# Patient Record
Sex: Female | Born: 1955
Health system: Southern US, Community
[De-identification: ages and names within clinical notes are randomized; demographics above are authoritative.]

## PROBLEM LIST (undated history)

## (undated) DIAGNOSIS — H269 Unspecified cataract: Secondary | ICD-10-CM

## (undated) DIAGNOSIS — E785 Hyperlipidemia, unspecified: Secondary | ICD-10-CM

## (undated) DIAGNOSIS — T7840XA Allergy, unspecified, initial encounter: Secondary | ICD-10-CM

## (undated) DIAGNOSIS — R0789 Other chest pain: Secondary | ICD-10-CM

## (undated) HISTORY — PX: WISDOM TOOTH EXTRACTION: SHX21

## (undated) HISTORY — DX: Hyperlipidemia, unspecified: E78.5

## (undated) HISTORY — PX: CHOLECYSTECTOMY: SHX55

## (undated) HISTORY — DX: Allergy, unspecified, initial encounter: T78.40XA

## (undated) HISTORY — DX: Unspecified cataract: H26.9

## (undated) HISTORY — PX: COLONOSCOPY: SHX174

## (undated) HISTORY — PX: DILATION AND CURETTAGE OF UTERUS: SHX78

## (undated) HISTORY — DX: Other chest pain: R07.89

---

## 1998-03-01 ENCOUNTER — Ambulatory Visit (HOSPITAL_COMMUNITY): Admission: RE | Admit: 1998-03-01 | Discharge: 1998-03-01 | Payer: Self-pay | Admitting: Obstetrics and Gynecology

## 1998-03-06 ENCOUNTER — Ambulatory Visit (HOSPITAL_COMMUNITY): Admission: RE | Admit: 1998-03-06 | Discharge: 1998-03-06 | Payer: Self-pay | Admitting: Obstetrics and Gynecology

## 1999-12-11 ENCOUNTER — Ambulatory Visit (HOSPITAL_COMMUNITY): Admission: RE | Admit: 1999-12-11 | Discharge: 1999-12-11 | Payer: Self-pay | Admitting: Obstetrics and Gynecology

## 1999-12-11 ENCOUNTER — Encounter: Payer: Self-pay | Admitting: Obstetrics and Gynecology

## 2000-09-09 ENCOUNTER — Other Ambulatory Visit: Admission: RE | Admit: 2000-09-09 | Discharge: 2000-09-09 | Payer: Self-pay | Admitting: *Deleted

## 2001-07-29 ENCOUNTER — Encounter: Payer: Self-pay | Admitting: Obstetrics and Gynecology

## 2001-07-29 ENCOUNTER — Ambulatory Visit (HOSPITAL_COMMUNITY): Admission: RE | Admit: 2001-07-29 | Discharge: 2001-07-29 | Payer: Self-pay | Admitting: Obstetrics and Gynecology

## 2002-08-12 ENCOUNTER — Ambulatory Visit (HOSPITAL_COMMUNITY): Admission: RE | Admit: 2002-08-12 | Discharge: 2002-08-12 | Payer: Self-pay | Admitting: Obstetrics and Gynecology

## 2002-08-12 ENCOUNTER — Encounter: Payer: Self-pay | Admitting: Obstetrics and Gynecology

## 2002-08-29 ENCOUNTER — Other Ambulatory Visit: Admission: RE | Admit: 2002-08-29 | Discharge: 2002-08-29 | Payer: Self-pay | Admitting: Obstetrics and Gynecology

## 2003-11-20 ENCOUNTER — Ambulatory Visit (HOSPITAL_COMMUNITY): Admission: RE | Admit: 2003-11-20 | Discharge: 2003-11-20 | Payer: Self-pay | Admitting: Obstetrics and Gynecology

## 2003-11-27 ENCOUNTER — Other Ambulatory Visit: Admission: RE | Admit: 2003-11-27 | Discharge: 2003-11-27 | Payer: Self-pay | Admitting: Obstetrics and Gynecology

## 2004-12-19 ENCOUNTER — Ambulatory Visit (HOSPITAL_COMMUNITY): Admission: RE | Admit: 2004-12-19 | Discharge: 2004-12-19 | Payer: Self-pay | Admitting: Obstetrics and Gynecology

## 2006-01-09 ENCOUNTER — Ambulatory Visit (HOSPITAL_COMMUNITY): Admission: RE | Admit: 2006-01-09 | Discharge: 2006-01-09 | Payer: Self-pay | Admitting: Obstetrics and Gynecology

## 2006-09-17 ENCOUNTER — Ambulatory Visit: Payer: Self-pay | Admitting: Psychology

## 2006-09-28 ENCOUNTER — Ambulatory Visit: Payer: Self-pay | Admitting: Psychology

## 2006-10-26 ENCOUNTER — Emergency Department (HOSPITAL_COMMUNITY): Admission: EM | Admit: 2006-10-26 | Discharge: 2006-10-26 | Payer: Self-pay | Admitting: Family Medicine

## 2006-10-26 ENCOUNTER — Ambulatory Visit: Payer: Self-pay | Admitting: Psychology

## 2006-11-13 ENCOUNTER — Ambulatory Visit: Payer: Self-pay | Admitting: Psychology

## 2006-11-18 ENCOUNTER — Ambulatory Visit: Payer: Self-pay | Admitting: Psychology

## 2006-11-23 ENCOUNTER — Ambulatory Visit: Payer: Self-pay | Admitting: Psychology

## 2006-12-07 ENCOUNTER — Ambulatory Visit: Payer: Self-pay | Admitting: Psychology

## 2006-12-21 ENCOUNTER — Ambulatory Visit: Payer: Self-pay | Admitting: Psychology

## 2007-01-04 ENCOUNTER — Ambulatory Visit: Payer: Self-pay | Admitting: Psychology

## 2007-01-18 ENCOUNTER — Ambulatory Visit: Payer: Self-pay | Admitting: Psychology

## 2007-02-01 ENCOUNTER — Ambulatory Visit: Payer: Self-pay | Admitting: Psychology

## 2007-02-05 ENCOUNTER — Ambulatory Visit (HOSPITAL_COMMUNITY): Admission: RE | Admit: 2007-02-05 | Discharge: 2007-02-05 | Payer: Self-pay | Admitting: Family Medicine

## 2007-03-15 ENCOUNTER — Ambulatory Visit: Payer: Self-pay | Admitting: Psychology

## 2008-03-30 ENCOUNTER — Ambulatory Visit (HOSPITAL_COMMUNITY): Admission: RE | Admit: 2008-03-30 | Discharge: 2008-03-30 | Payer: Self-pay | Admitting: Obstetrics and Gynecology

## 2008-04-21 ENCOUNTER — Ambulatory Visit (HOSPITAL_COMMUNITY): Admission: RE | Admit: 2008-04-21 | Discharge: 2008-04-21 | Payer: Self-pay | Admitting: Obstetrics and Gynecology

## 2008-10-26 ENCOUNTER — Ambulatory Visit: Payer: Self-pay | Admitting: Internal Medicine

## 2008-11-10 ENCOUNTER — Ambulatory Visit: Payer: Self-pay | Admitting: Internal Medicine

## 2009-04-12 ENCOUNTER — Ambulatory Visit (HOSPITAL_COMMUNITY): Admission: RE | Admit: 2009-04-12 | Discharge: 2009-04-12 | Payer: Self-pay | Admitting: Obstetrics and Gynecology

## 2009-08-24 ENCOUNTER — Ambulatory Visit (HOSPITAL_COMMUNITY): Admission: RE | Admit: 2009-08-24 | Discharge: 2009-08-24 | Payer: Self-pay | Admitting: Sports Medicine

## 2010-01-31 ENCOUNTER — Ambulatory Visit: Payer: Self-pay | Admitting: Psychology

## 2010-02-11 ENCOUNTER — Ambulatory Visit: Payer: Self-pay | Admitting: Psychology

## 2010-03-03 ENCOUNTER — Emergency Department (HOSPITAL_COMMUNITY): Admission: EM | Admit: 2010-03-03 | Discharge: 2010-03-03 | Payer: Self-pay | Admitting: Family Medicine

## 2010-04-15 ENCOUNTER — Ambulatory Visit (HOSPITAL_COMMUNITY): Admission: RE | Admit: 2010-04-15 | Discharge: 2010-04-15 | Payer: Self-pay | Admitting: Obstetrics and Gynecology

## 2010-12-09 LAB — POCT URINALYSIS DIP (DEVICE)
Glucose, UA: 250 mg/dL — AB
Nitrite: POSITIVE — AB
pH: 8.5 — ABNORMAL HIGH (ref 5.0–8.0)

## 2010-12-09 LAB — URINE CULTURE: Colony Count: >=100000

## 2011-02-25 ENCOUNTER — Ambulatory Visit (INDEPENDENT_AMBULATORY_CARE_PROVIDER_SITE_OTHER): Payer: 59 | Admitting: Psychology

## 2011-02-25 DIAGNOSIS — F909 Attention-deficit hyperactivity disorder, unspecified type: Secondary | ICD-10-CM

## 2011-04-22 ENCOUNTER — Other Ambulatory Visit (HOSPITAL_COMMUNITY): Payer: Self-pay | Admitting: Obstetrics and Gynecology

## 2011-04-22 DIAGNOSIS — Z1231 Encounter for screening mammogram for malignant neoplasm of breast: Secondary | ICD-10-CM

## 2011-04-25 ENCOUNTER — Ambulatory Visit (HOSPITAL_COMMUNITY)
Admission: RE | Admit: 2011-04-25 | Discharge: 2011-04-25 | Disposition: A | Discharge status: Home / Self Care | Payer: 59 | Source: Ambulatory Visit | Attending: Obstetrics and Gynecology | Admitting: Obstetrics and Gynecology

## 2011-04-25 DIAGNOSIS — Z1231 Encounter for screening mammogram for malignant neoplasm of breast: Secondary | ICD-10-CM | POA: Insufficient documentation

## 2012-04-26 ENCOUNTER — Other Ambulatory Visit (HOSPITAL_COMMUNITY): Payer: Self-pay | Admitting: Obstetrics and Gynecology

## 2012-04-26 DIAGNOSIS — Z1231 Encounter for screening mammogram for malignant neoplasm of breast: Secondary | ICD-10-CM

## 2012-04-27 ENCOUNTER — Ambulatory Visit (HOSPITAL_COMMUNITY)
Admission: RE | Admit: 2012-04-27 | Discharge: 2012-04-27 | Disposition: A | Discharge status: Home / Self Care | Payer: 59 | Source: Ambulatory Visit | Attending: Obstetrics and Gynecology | Admitting: Obstetrics and Gynecology

## 2012-04-27 DIAGNOSIS — Z1231 Encounter for screening mammogram for malignant neoplasm of breast: Secondary | ICD-10-CM | POA: Insufficient documentation

## 2013-03-02 ENCOUNTER — Encounter (HOSPITAL_COMMUNITY): Payer: Self-pay | Admitting: Emergency Medicine

## 2013-03-02 ENCOUNTER — Emergency Department (INDEPENDENT_AMBULATORY_CARE_PROVIDER_SITE_OTHER)
Admission: EM | Admit: 2013-03-02 | Discharge: 2013-03-02 | Disposition: A | Discharge status: Home / Self Care | Payer: 59 | Source: Home / Self Care | Attending: Family Medicine | Admitting: Family Medicine

## 2013-03-02 DIAGNOSIS — IMO0001 Reserved for inherently not codable concepts without codable children: Secondary | ICD-10-CM

## 2013-03-02 DIAGNOSIS — W57XXXA Bitten or stung by nonvenomous insect and other nonvenomous arthropods, initial encounter: Secondary | ICD-10-CM

## 2013-03-02 MED ORDER — DOXYCYCLINE HYCLATE 100 MG PO CAPS: 100.0000 mg | ORAL_CAPSULE | Freq: Two times a day (BID) | ORAL | Status: DC

## 2013-03-02 MED ORDER — CETIRIZINE HCL 10 MG PO TABS: 10.0000 mg | ORAL_TABLET | Freq: Every evening | ORAL | Status: DC | PRN

## 2013-03-02 MED ORDER — TRIAMCINOLONE ACETONIDE 0.5 % EX OINT: TOPICAL_OINTMENT | Freq: Two times a day (BID) | CUTANEOUS | Status: DC

## 2013-03-02 NOTE — ED Notes (Signed)
Reports noticing a tick on left chest on 6/3.  Increased redness noted .  Also , reports stiff/sore neck.  Patient reports no fever.

## 2013-03-02 NOTE — ED Provider Notes (Signed)
History     CSN: [id number]  Arrival date & time 03/02/13  1001   First MD Initiated Contact with Patient 03/02/13 1025      Chief Complaint  Patient presents with  . Tick Removal    (Consider location/radiation/quality/duration/timing/severity/associated sxs/prior treatment) HPI Comments: 57 year old female nondiabetic. Here complaining of a area of redness and tenderness in her left upper chest after a deer tick bite about one week ago. Patient states that the removed tape was small and not engorged. Patient states she has felt some stiffness in her neck but is unsure if related. Denies fever or general malaise. No fatigue. No headache. No joint pain. No abdominal pain nausea or vomiting. Patient states she's otherwise doing well.   History reviewed. No pertinent past medical history.  Past Surgical History  Procedure Laterality Date  . Cholecystectomy      No family history on file.  History  Substance Use Topics  . Smoking status: Never Smoker   . Smokeless tobacco: Not on file  . Alcohol Use: Yes    OB History   Grav Para Term Preterm Abortions TAB SAB Ect Mult Living                  Review of Systems  Constitutional: Negative for fever, chills, activity change, appetite change and fatigue.  HENT: Positive for neck pain and neck stiffness. Negative for sore throat.   Respiratory: Negative for shortness of breath.   Gastrointestinal: Negative for nausea, vomiting, abdominal pain and diarrhea.  Musculoskeletal: Negative for myalgias, joint swelling and arthralgias.  Skin: Positive for rash.  Neurological: Negative for dizziness and headaches.  All other systems reviewed and are negative.    Allergies  Review of patient's allergies indicates no known allergies.  Home Medications   Current Outpatient Rx  Name  Route  Sig  Dispense  Refill  . cetirizine (ZYRTEC) 10 MG tablet   Oral   Take 1 tablet (10 mg total) by mouth at bedtime as needed for  allergies.   30 tablet   0   . doxycycline (VIBRAMYCIN) 100 MG capsule   Oral   Take 1 capsule (100 mg total) by mouth 2 (two) times daily.   20 capsule   0   . triamcinolone ointment (KENALOG) 0.5 %   Topical   Apply topically 2 (two) times daily.   30 g   0     BP 114/64  Pulse 77  Temp(Src) 98.2 F (36.8 C) (Oral)  Resp 17  SpO2 99%  Physical Exam  Nursing note and vitals reviewed. Constitutional: She is oriented to person, place, and time. She appears well-developed and well-nourished. No distress.  HENT:  Head: Normocephalic and atraumatic.  Mouth/Throat: Oropharynx is clear and moist.  Eyes: Conjunctivae are normal. No scleral icterus.  Neck: Neck supple. No JVD present. No thyromegaly present.  Cardiovascular: Normal rate, regular rhythm and normal heart sounds.   No murmur heard. Pulmonary/Chest: Effort normal and breath sounds normal.  Abdominal: Soft.  No hepatosplenomegaly  Musculoskeletal: Normal range of motion. She exhibits no edema and no tenderness.  Lymphadenopathy:    She has no cervical adenopathy.  Neurological: She is alert and oriented to person, place, and time.  Skin: Rash noted. She is not diaphoretic.  There is a small pustule with a dry yellow scab on top there is associated erythema around about 3cm concentric. Minimally tender. No skin thickening. No fluctuations or induration.    ED Course  Procedures (  including critical care time)  Labs Reviewed - No data to display No results found.   1. Tick bite, infected, initial encounter       MDM  Impress allergic reaction to tick body parts like still in the skin vs mild cellulitis. Decided to prescribe doxycycline, cetirizine and transient along ointment. No serology performed as on likely high risk for tick borne diseases (Rickettsia) as patient reported small take the was not engorged at the time of removal. Also no classic symptoms for her general disease. Patient  agreeable. Supportive care and red flags that should prompt her return to medical attention discussed with patient and provided in writing.       Sharin Grave, MD 03/02/13 1505

## 2013-04-12 ENCOUNTER — Other Ambulatory Visit (HOSPITAL_COMMUNITY): Payer: Self-pay | Admitting: Obstetrics and Gynecology

## 2013-04-12 DIAGNOSIS — Z1231 Encounter for screening mammogram for malignant neoplasm of breast: Secondary | ICD-10-CM

## 2013-04-28 ENCOUNTER — Ambulatory Visit (HOSPITAL_COMMUNITY)
Admission: RE | Admit: 2013-04-28 | Discharge: 2013-04-28 | Disposition: A | Discharge status: Home / Self Care | Payer: 59 | Source: Ambulatory Visit | Attending: Obstetrics and Gynecology | Admitting: Obstetrics and Gynecology

## 2013-04-28 DIAGNOSIS — Z1231 Encounter for screening mammogram for malignant neoplasm of breast: Secondary | ICD-10-CM | POA: Insufficient documentation

## 2013-05-13 ENCOUNTER — Other Ambulatory Visit: Payer: 59 | Admitting: Internal Medicine

## 2013-05-13 DIAGNOSIS — Z13228 Encounter for screening for other metabolic disorders: Secondary | ICD-10-CM

## 2013-05-13 DIAGNOSIS — Z130 Encounter for screening for diseases of the blood and blood-forming organs and certain disorders involving the immune mechanism: Secondary | ICD-10-CM

## 2013-05-13 DIAGNOSIS — Z1329 Encounter for screening for other suspected endocrine disorder: Secondary | ICD-10-CM

## 2013-05-13 DIAGNOSIS — Z0000 Encounter for general adult medical examination without abnormal findings: Secondary | ICD-10-CM

## 2013-05-13 LAB — CBC WITH DIFFERENTIAL/PLATELET
Basophils Absolute: 0.1 10*3/uL (ref 0.0–0.1)
Basophils Relative: 1 % (ref 0–1)
Eosinophils Absolute: 0 10*3/uL (ref 0.0–0.7)
HCT: 39.5 % (ref 36.0–46.0)
Lymphocytes Relative: 35 % (ref 12–46)
Lymphs Abs: 1.5 10*3/uL (ref 0.7–4.0)
MCV: 85.7 fL (ref 78.0–100.0)
Monocytes Absolute: 0.3 10*3/uL (ref 0.1–1.0)
Monocytes Relative: 7 % (ref 3–12)
RBC: 4.61 MIL/uL (ref 3.87–5.11)
RDW: 15.8 % — ABNORMAL HIGH (ref 11.5–15.5)
WBC: 4.3 10*3/uL (ref 4.0–10.5)

## 2013-05-13 LAB — COMPREHENSIVE METABOLIC PANEL
ALT: 9 U/L (ref 0–35)
AST: 16 U/L (ref 0–37)
Albumin: 4.1 g/dL (ref 3.5–5.2)
Alkaline Phosphatase: 62 U/L (ref 39–117)
Potassium: 4.2 mEq/L (ref 3.5–5.3)
Sodium: 139 mEq/L (ref 135–145)
Total Bilirubin: 0.6 mg/dL (ref 0.3–1.2)
Total Protein: 6.7 g/dL (ref 6.0–8.3)

## 2013-05-13 LAB — LIPID PANEL
LDL Cholesterol: 97 mg/dL (ref 0–99)
Triglycerides: 53 mg/dL (ref <150)
VLDL: 11 mg/dL (ref 0–40)

## 2013-05-13 LAB — TSH: TSH: 0.837 u[IU]/mL (ref 0.350–4.500)

## 2013-05-14 LAB — VITAMIN D 25 HYDROXY (VIT D DEFICIENCY, FRACTURES): Vit D, 25-Hydroxy: 32 ng/mL (ref 30–89)

## 2013-05-16 ENCOUNTER — Encounter: Payer: Self-pay | Admitting: Internal Medicine

## 2013-05-16 ENCOUNTER — Ambulatory Visit (INDEPENDENT_AMBULATORY_CARE_PROVIDER_SITE_OTHER): Payer: 59 | Admitting: Internal Medicine

## 2013-05-16 VITALS — BP 120/84 | HR 72 | Ht 66.5 in | Wt 169.0 lb

## 2013-05-16 DIAGNOSIS — J309 Allergic rhinitis, unspecified: Secondary | ICD-10-CM

## 2013-05-16 DIAGNOSIS — J322 Chronic ethmoidal sinusitis: Secondary | ICD-10-CM

## 2013-05-16 DIAGNOSIS — Z23 Encounter for immunization: Secondary | ICD-10-CM

## 2013-05-16 DIAGNOSIS — J019 Acute sinusitis, unspecified: Secondary | ICD-10-CM

## 2013-05-16 DIAGNOSIS — R079 Chest pain, unspecified: Secondary | ICD-10-CM

## 2013-05-16 DIAGNOSIS — Z0000 Encounter for general adult medical examination without abnormal findings: Secondary | ICD-10-CM

## 2013-05-16 LAB — POCT URINALYSIS DIPSTICK
Ketones, UA: NEGATIVE
Leukocytes, UA: NEGATIVE
Protein, UA: NEGATIVE
Urobilinogen, UA: NEGATIVE
pH, UA: 5.5

## 2013-05-16 MED ORDER — PREDNISONE (PAK) 10 MG PO TABS: 10.0000 mg | ORAL_TABLET | Freq: Every day | ORAL | Status: DC

## 2013-05-16 MED ORDER — FLUTICASONE PROPIONATE 50 MCG/ACT NA SUSP: 2.0000 | Freq: Every day | NASAL | Status: DC

## 2013-05-16 MED ORDER — PREDNISONE 10 MG PO KIT: 10.0000 mg | PACK | Freq: Every day | ORAL | Status: DC

## 2013-05-16 MED ORDER — AMOXICILLIN-POT CLAVULANATE 500-125 MG PO TABS: 1.0000 | ORAL_TABLET | Freq: Three times a day (TID) | ORAL | Status: DC

## 2013-05-21 ENCOUNTER — Encounter: Payer: Self-pay | Admitting: Internal Medicine

## 2013-05-21 DIAGNOSIS — J309 Allergic rhinitis, unspecified: Secondary | ICD-10-CM | POA: Insufficient documentation

## 2013-05-21 NOTE — Addendum Note (Signed)
Addended by: Margaree Mackintosh on: 05/21/2013 11:22 PM   Modules accepted: Level of Service

## 2013-05-21 NOTE — Progress Notes (Addendum)
Subjective:    Patient ID: Doris Brown, female    DOB: [birth year], 57 y.o.   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 Patient has been having some issues with chest tightness recently. She noticed this initially while hiking [place] her [place] the [place]. Has noticed some chest pain at rest as well. No shortness of breath. Chest pain is substernal it does not radiate. Not associated with nausea vomiting or diaphoresis. Also recently found at deer tick on her  chest. Subsequently developed a  Skin lesion c/w erythema migrans. Was treated in urgent care with doxycycline but was not tested for Lyme disease. Was never symptomatic other than skin lesion.  Patient has constant postnasal drip and headache. Nasal drainage is clear. No fever.  Tetanus immunization update given today.  GYN exam is done By Debbora Dus., [facility]. Has had colonoscopy in the past by Dr. Leone Payor. Had bone density study at age 33. Has had recent mammogram.   Patient is allergic to sulfa. It causes a rash.  Social history: She has 2 adult sons from her first marriage. Current husband as a Doctor, general practice for [employer]. Patient has a Event organiser. Does not smoke. Drinks 1 or 2 glasses of wine on the weekends.  Family history: Father died at age 47 of a benign brain tumor. Mother living age 27 in good health except for glaucoma. One sister in good health.  Patient had cholecystectomy around 2004. Had D &E  1989. 3 pregnancies and one miscarriage.  No chronic medications.    Review of Systems  Constitutional: Negative.   HENT: Negative.   Eyes: Negative.   Respiratory: Negative.   Cardiovascular: Positive for chest pain.  Gastrointestinal: Negative.   Endocrine: Negative.   Genitourinary: Negative.   Allergic/Immunologic:       Postnasal drip  Neurological: Negative.    Hematological: Negative.   Psychiatric/Behavioral: Negative.        Objective:   Physical Exam  Vitals reviewed. Constitutional: She is oriented to person, place, and time. She appears well-developed and well-nourished. No distress.  HENT:  Head: Normocephalic and atraumatic.  Left Ear: External ear normal.  Mouth/Throat: Oropharynx is clear and moist. No oropharyngeal exudate.  Eyes: Conjunctivae and EOM are normal. Pupils are equal, round, and reactive to light. Left eye exhibits no discharge. No scleral icterus.  Neck: Neck supple. No JVD present. No thyromegaly present.  Cardiovascular: Normal rate, regular rhythm, normal heart sounds and intact distal pulses.   No murmur heard. Pulmonary/Chest: Effort normal and breath sounds normal. No respiratory distress. She has no wheezes. She has no rales. She exhibits no tenderness.  Abdominal: Soft. Bowel sounds are normal. She exhibits no distension and no mass. There is no tenderness. There is no rebound and no guarding.  Genitourinary:  Deferred to GYN  Musculoskeletal: Normal range of motion. She exhibits no edema.  Lymphadenopathy:    She has no cervical adenopathy.  Neurological: She is alert and oriented to person, place, and time. She has normal reflexes. No cranial nerve deficit. Coordination normal.  Skin: Skin is warm and dry. No rash noted. She is not diaphoretic.  Psychiatric: She has a normal mood and affect. Her behavior is normal. Judgment and thought content normal.          Assessment & Plan:  Chest tightness with hiking and at rest. No family history  of coronary disease. EKG is within normal limits. Patient will be referred to cardiologist for further evaluation. Patient  Is to have chest x-ray at hospital--order placed.  Postnasal drip-probably has allergic rhinitis. Treated with Flonase nasal spray and prednisone 10 mg 6 day tapering dose pack.  Acute sinusitis-treat with Augmentin 500 mg 3 times a day  For 10  days  Return in one year or as needed. Tetanus immunization update given today. She will obtain influenza immunization through employment.

## 2013-05-21 NOTE — Patient Instructions (Addendum)
We will arrange for cardiology evaluation. Return in one year or as needed. Take six-day prednisone taper as directed. Take Augmentin 500 mg 3 times daily for 10 days for sinusitis. Use Flonase nasal spray daily. Have chest x-ray done at hospital in the near future.

## 2013-05-26 ENCOUNTER — Other Ambulatory Visit (HOSPITAL_COMMUNITY): Payer: Self-pay | Admitting: Obstetrics and Gynecology

## 2013-05-26 DIAGNOSIS — Z780 Asymptomatic menopausal state: Secondary | ICD-10-CM

## 2013-07-01 ENCOUNTER — Ambulatory Visit (HOSPITAL_COMMUNITY)
Admission: RE | Admit: 2013-07-01 | Discharge: 2013-07-01 | Disposition: A | Discharge status: Home / Self Care | Payer: 59 | Source: Ambulatory Visit | Attending: Obstetrics and Gynecology | Admitting: Obstetrics and Gynecology

## 2013-07-01 DIAGNOSIS — Z13820 Encounter for screening for osteoporosis: Secondary | ICD-10-CM | POA: Insufficient documentation

## 2013-07-01 DIAGNOSIS — Z780 Asymptomatic menopausal state: Secondary | ICD-10-CM | POA: Insufficient documentation

## 2013-07-19 ENCOUNTER — Encounter: Payer: Self-pay | Admitting: Interventional Cardiology

## 2013-07-19 ENCOUNTER — Encounter: Payer: Self-pay | Admitting: *Deleted

## 2013-07-19 DIAGNOSIS — R0789 Other chest pain: Secondary | ICD-10-CM | POA: Insufficient documentation

## 2013-07-20 ENCOUNTER — Encounter: Payer: 59 | Admitting: Interventional Cardiology

## 2013-07-20 ENCOUNTER — Ambulatory Visit: Payer: 59 | Admitting: Interventional Cardiology

## 2013-07-20 ENCOUNTER — Ambulatory Visit (INDEPENDENT_AMBULATORY_CARE_PROVIDER_SITE_OTHER): Payer: 59 | Admitting: Interventional Cardiology

## 2013-07-20 DIAGNOSIS — R002 Palpitations: Secondary | ICD-10-CM

## 2013-07-20 DIAGNOSIS — R079 Chest pain, unspecified: Secondary | ICD-10-CM

## 2013-07-20 NOTE — Progress Notes (Signed)
Exercise Treadmill Test  Pre-Exercise Testing Evaluation Rhythm: normal sinus  Rate: 86 bpm     Test  Exercise Tolerance Test Ordering MD: Verdis Prime, MD  Interpreting MD: Verdis Prime, MD  Unique Test No: 1  Treadmill:  1  Indication for ETT: chest pain/palps  Contraindication to ETT: No   Stress Modality: exercise - treadmill  Cardiac Imaging Performed: non   Protocol: standard Bruce - maximal  Max BP:  145/53  Max MPHR (bpm):  165 85% MPR (bpm):  140  MPHR obtained (bpm):  176 % MPHR obtained:  106%  Reached 85% MPHR (min:sec):  4:40 mins Total Exercise Time (min-sec):  10:00 minutes  Workload in METS:  11.6 Borg Scale:   Reason ETT Terminated:  Surpassed target HR    ST Segment Analysis At Rest: normal ST segments - no evidence of significant ST depression With Exercise: no evidence of significant ST depression  Other Information Arrhythmia:  No Angina during ETT:  absent (0) Quality of ETT:  diagnostic  ETT Interpretation:  normal - no evidence of ischemia by ST analysis  Comments: Excellent exercise tolerance with no limitations  Recommendations: F/U if recurrent symptoms

## 2014-05-08 ENCOUNTER — Other Ambulatory Visit (HOSPITAL_COMMUNITY): Payer: Self-pay | Admitting: Obstetrics and Gynecology

## 2014-05-08 DIAGNOSIS — Z1231 Encounter for screening mammogram for malignant neoplasm of breast: Secondary | ICD-10-CM

## 2014-05-10 ENCOUNTER — Ambulatory Visit (HOSPITAL_COMMUNITY): Payer: Managed Care, Other (non HMO)

## 2014-05-10 ENCOUNTER — Ambulatory Visit (HOSPITAL_COMMUNITY)
Admission: RE | Admit: 2014-05-10 | Discharge: 2014-05-10 | Disposition: A | Discharge status: Home / Self Care | Payer: Managed Care, Other (non HMO) | Source: Ambulatory Visit | Attending: Obstetrics and Gynecology | Admitting: Obstetrics and Gynecology

## 2014-05-10 DIAGNOSIS — Z1231 Encounter for screening mammogram for malignant neoplasm of breast: Secondary | ICD-10-CM | POA: Diagnosis not present

## 2014-06-26 ENCOUNTER — Other Ambulatory Visit: Payer: Managed Care, Other (non HMO) | Admitting: Internal Medicine

## 2014-06-26 DIAGNOSIS — Z0000 Encounter for general adult medical examination without abnormal findings: Secondary | ICD-10-CM

## 2014-06-26 DIAGNOSIS — Z13220 Encounter for screening for lipoid disorders: Secondary | ICD-10-CM

## 2014-06-26 DIAGNOSIS — Z1321 Encounter for screening for nutritional disorder: Secondary | ICD-10-CM

## 2014-06-26 DIAGNOSIS — E785 Hyperlipidemia, unspecified: Secondary | ICD-10-CM

## 2014-06-26 DIAGNOSIS — Z1329 Encounter for screening for other suspected endocrine disorder: Secondary | ICD-10-CM

## 2014-06-26 DIAGNOSIS — Z130 Encounter for screening for diseases of the blood and blood-forming organs and certain disorders involving the immune mechanism: Secondary | ICD-10-CM

## 2014-06-26 LAB — CBC WITH DIFFERENTIAL/PLATELET
Basophils Absolute: 0 10*3/uL (ref 0.0–0.1)
Basophils Relative: 1 % (ref 0–1)
EOS ABS: 0 10*3/uL (ref 0.0–0.7)
Eosinophils Relative: 1 % (ref 0–5)
HCT: 39.8 % (ref 36.0–46.0)
HEMOGLOBIN: 13.3 g/dL (ref 12.0–15.0)
LYMPHS ABS: 1.6 10*3/uL (ref 0.7–4.0)
LYMPHS PCT: 33 % (ref 12–46)
MCH: 30.7 pg (ref 26.0–34.0)
MCHC: 33.4 g/dL (ref 30.0–36.0)
MCV: 91.9 fL (ref 78.0–100.0)
MONOS PCT: 7 % (ref 3–12)
Monocytes Absolute: 0.3 10*3/uL (ref 0.1–1.0)
NEUTROS PCT: 58 % (ref 43–77)
Neutro Abs: 2.7 10*3/uL (ref 1.7–7.7)
Platelets: 283 10*3/uL (ref 150–400)
RBC: 4.33 MIL/uL (ref 3.87–5.11)
RDW: 13.5 % (ref 11.5–15.5)
WBC: 4.7 10*3/uL (ref 4.0–10.5)

## 2014-06-27 ENCOUNTER — Encounter: Payer: Self-pay | Admitting: Internal Medicine

## 2014-06-27 ENCOUNTER — Ambulatory Visit (INDEPENDENT_AMBULATORY_CARE_PROVIDER_SITE_OTHER): Payer: Managed Care, Other (non HMO) | Admitting: Internal Medicine

## 2014-06-27 VITALS — BP 110/78 | HR 88 | Temp 98.4°F | Ht 65.5 in | Wt 155.0 lb

## 2014-06-27 DIAGNOSIS — Z23 Encounter for immunization: Secondary | ICD-10-CM

## 2014-06-27 DIAGNOSIS — M542 Cervicalgia: Secondary | ICD-10-CM

## 2014-06-27 DIAGNOSIS — Z0000 Encounter for general adult medical examination without abnormal findings: Secondary | ICD-10-CM

## 2014-06-27 LAB — COMPREHENSIVE METABOLIC PANEL
ALBUMIN: 4 g/dL (ref 3.5–5.2)
ALT: 10 U/L (ref 0–35)
AST: 16 U/L (ref 0–37)
Alkaline Phosphatase: 46 U/L (ref 39–117)
BILIRUBIN TOTAL: 0.5 mg/dL (ref 0.2–1.2)
BUN: 24 mg/dL — ABNORMAL HIGH (ref 6–23)
CALCIUM: 9.1 mg/dL (ref 8.4–10.5)
CHLORIDE: 106 meq/L (ref 96–112)
CO2: 26 meq/L (ref 19–32)
Creat: 0.67 mg/dL (ref 0.50–1.10)
GLUCOSE: 96 mg/dL (ref 70–99)
Potassium: 4.5 mEq/L (ref 3.5–5.3)
SODIUM: 140 meq/L (ref 135–145)
TOTAL PROTEIN: 6.5 g/dL (ref 6.0–8.3)

## 2014-06-27 LAB — LIPID PANEL
CHOLESTEROL: 194 mg/dL (ref 0–200)
HDL: 76 mg/dL (ref >39)
LDL Cholesterol: 108 mg/dL — ABNORMAL HIGH (ref 0–99)
Total CHOL/HDL Ratio: 2.6 Ratio
Triglycerides: 51 mg/dL (ref <150)
VLDL: 10 mg/dL (ref 0–40)

## 2014-06-27 LAB — POCT URINALYSIS DIPSTICK
BILIRUBIN UA: NEGATIVE
Blood, UA: NEGATIVE
GLUCOSE UA: NEGATIVE
Ketones, UA: POSITIVE
LEUKOCYTES UA: NEGATIVE
NITRITE UA: NEGATIVE
Protein, UA: NEGATIVE
Spec Grav, UA: 1.01
UROBILINOGEN UA: NEGATIVE
pH, UA: 6

## 2014-06-27 LAB — VITAMIN D 25 HYDROXY (VIT D DEFICIENCY, FRACTURES): VIT D 25 HYDROXY: 40 ng/mL (ref 30–89)

## 2014-06-27 LAB — TSH: TSH: 1.632 u[IU]/mL (ref 0.350–4.500)

## 2014-06-27 NOTE — Patient Instructions (Addendum)
Apply ice or heat as needed. Do range of motion exercises. RTC in one year or prn.  Flu vaccine given

## 2014-06-27 NOTE — Progress Notes (Signed)
   Subjective:    Patient ID: Doris Brown, female    DOB: 07/02/1956, 58 y.o.   MRN: [id number]  HPI7 year old Female for CPE. Has GYN whom she will see in November. Flu vaccine given today. New problem is neck pain. May have gotten strained while traveling with heavy suit case and sitting in long meetings. No radiculopathy.  Patient is allergic to sulfa. Causes arrange.  GYN exam was done at [facility].  Patient has had colonoscopy in 2010 by Dr. Carlean Purl. Had bone density study at age 4. In October 2014, she saw Dr. Pernell Dupre for palpitations. Had normal exercise tolerance test.  Patient had cholecystectomy around 2004. Had a D and E in 1989. 3 pregnancies and one miscarriage.  No chronic medications  Social history: She has 2 adult son from first marriage.  Husband is a Music therapist. Patient has a Scientist, water quality. Does not smoke. Drinks 1 or 2 glasses of wine on the weekends.  Family history: Father died at age 31 of a benign brain tumor. Mother living in good health, age 41, has glaucoma. One sister in good health    Review of Systems  Constitutional: Negative.   Musculoskeletal:       Neck pain  All other systems reviewed and are negative.      Objective:   Physical Exam  Vitals reviewed. Constitutional: She is oriented to person, place, and time. She appears well-developed and well-nourished. No distress.  HENT:  Head: Normocephalic and atraumatic.  Right Ear: External ear normal.  Left Ear: External ear normal.  Mouth/Throat: Oropharynx is clear and moist. No oropharyngeal exudate.  Eyes: Conjunctivae and EOM are normal. Pupils are equal, round, and reactive to light. Right eye exhibits no discharge. Left eye exhibits no discharge. No scleral icterus.  Neck: Neck supple. No JVD present. No thyromegaly present.  Cardiovascular: Normal rate, regular rhythm, normal heart sounds and intact distal pulses.   No murmur heard. Pulmonary/Chest: Effort normal and  breath sounds normal. No respiratory distress. She has no wheezes. She has no rales. She exhibits no tenderness.  Abdominal: Soft. Bowel sounds are normal. She exhibits no distension and no mass. There is no tenderness. There is no rebound and no guarding.  Genitourinary:  Deferred to GYN  Musculoskeletal: Normal range of motion. She exhibits no edema.  Bilateral tenderness SCM and trapezius mescles with tightness.  Lymphadenopathy:    She has no cervical adenopathy.  Neurological: She is alert and oriented to person, place, and time. She has normal reflexes. No cranial nerve deficit. Coordination normal.  Skin: Skin is warm and dry. No rash noted. She is not diaphoretic.  Psychiatric: She has a normal mood and affect. Her behavior is normal. Judgment and thought content normal.          Assessment & Plan:  Neck pain  Plan: Ice or heat with ROM exercises. Does not want muscle relaxants or PT at this time. RTC one year or prn.

## 2015-03-30 ENCOUNTER — Encounter: Payer: Self-pay | Admitting: Internal Medicine

## 2015-04-06 ENCOUNTER — Other Ambulatory Visit: Payer: Self-pay | Admitting: Internal Medicine

## 2015-04-06 DIAGNOSIS — Z1231 Encounter for screening mammogram for malignant neoplasm of breast: Secondary | ICD-10-CM

## 2015-04-13 ENCOUNTER — Ambulatory Visit (HOSPITAL_COMMUNITY)
Admission: RE | Admit: 2015-04-13 | Discharge: 2015-04-13 | Disposition: A | Discharge status: Home / Self Care | Payer: Managed Care, Other (non HMO) | Source: Ambulatory Visit | Attending: Internal Medicine | Admitting: Internal Medicine

## 2015-04-13 DIAGNOSIS — Z1231 Encounter for screening mammogram for malignant neoplasm of breast: Secondary | ICD-10-CM | POA: Insufficient documentation

## 2015-09-18 ENCOUNTER — Ambulatory Visit: Payer: Self-pay | Admitting: Internal Medicine

## 2016-04-02 ENCOUNTER — Other Ambulatory Visit: Payer: Self-pay | Admitting: Obstetrics and Gynecology

## 2016-04-02 ENCOUNTER — Other Ambulatory Visit: Payer: Self-pay | Admitting: Internal Medicine

## 2016-04-02 DIAGNOSIS — Z1231 Encounter for screening mammogram for malignant neoplasm of breast: Secondary | ICD-10-CM

## 2016-04-15 ENCOUNTER — Ambulatory Visit
Admission: RE | Admit: 2016-04-15 | Discharge: 2016-04-15 | Disposition: A | Discharge status: Home / Self Care | Payer: BLUE CROSS/BLUE SHIELD | Source: Ambulatory Visit | Attending: Obstetrics and Gynecology | Admitting: Obstetrics and Gynecology

## 2016-04-15 DIAGNOSIS — Z1231 Encounter for screening mammogram for malignant neoplasm of breast: Secondary | ICD-10-CM

## 2016-04-22 HISTORY — PX: COLONOSCOPY: SHX174

## 2017-01-15 ENCOUNTER — Ambulatory Visit (INDEPENDENT_AMBULATORY_CARE_PROVIDER_SITE_OTHER): Payer: BLUE CROSS/BLUE SHIELD | Admitting: Internal Medicine

## 2017-01-15 ENCOUNTER — Encounter: Payer: Self-pay | Admitting: Internal Medicine

## 2017-01-15 VITALS — BP 112/82 | HR 72 | Temp 98.4°F | Ht 66.0 in | Wt 169.0 lb

## 2017-01-15 DIAGNOSIS — E559 Vitamin D deficiency, unspecified: Secondary | ICD-10-CM | POA: Diagnosis not present

## 2017-01-15 DIAGNOSIS — Z0000 Encounter for general adult medical examination without abnormal findings: Secondary | ICD-10-CM | POA: Diagnosis not present

## 2017-01-15 LAB — POCT URINALYSIS DIPSTICK
Bilirubin, UA: NEGATIVE
Blood, UA: NEGATIVE
GLUCOSE UA: NEGATIVE
Ketones, UA: NEGATIVE
LEUKOCYTES UA: NEGATIVE
NITRITE UA: NEGATIVE
Protein, UA: NEGATIVE
Spec Grav, UA: 1.015 (ref 1.010–1.025)
UROBILINOGEN UA: 0.2 U/dL
pH, UA: 6.5 (ref 5.0–8.0)

## 2017-01-15 LAB — COMPREHENSIVE METABOLIC PANEL
ALBUMIN: 4.3 g/dL (ref 3.6–5.1)
ALK PHOS: 52 U/L (ref 33–130)
ALT: 11 U/L (ref 6–29)
AST: 18 U/L (ref 10–35)
BILIRUBIN TOTAL: 0.5 mg/dL (ref 0.2–1.2)
BUN: 12 mg/dL (ref 7–25)
CO2: 20 mmol/L (ref 20–31)
CREATININE: 0.71 mg/dL (ref 0.50–1.05)
Calcium: 9.4 mg/dL (ref 8.6–10.4)
Chloride: 108 mmol/L (ref 98–110)
GLUCOSE: 96 mg/dL (ref 65–99)
Potassium: 4.2 mmol/L (ref 3.5–5.3)
SODIUM: 143 mmol/L (ref 135–146)
Total Protein: 6.8 g/dL (ref 6.1–8.1)

## 2017-01-15 LAB — CBC WITH DIFFERENTIAL/PLATELET
BASOS PCT: 1 %
Basophils Absolute: 46 cells/uL (ref 0–200)
EOS PCT: 1 %
Eosinophils Absolute: 46 cells/uL (ref 15–500)
HEMATOCRIT: 41.1 % (ref 35.0–45.0)
HEMOGLOBIN: 13.1 g/dL (ref 11.7–15.5)
LYMPHS ABS: 1702 {cells}/uL (ref 850–3900)
Lymphocytes Relative: 37 %
MCH: 29.1 pg (ref 27.0–33.0)
MCHC: 31.9 g/dL — ABNORMAL LOW (ref 32.0–36.0)
MCV: 91.3 fL (ref 80.0–100.0)
MONO ABS: 414 {cells}/uL (ref 200–950)
MPV: 9.8 fL (ref 7.5–12.5)
Monocytes Relative: 9 %
NEUTROS ABS: 2392 {cells}/uL (ref 1500–7800)
Neutrophils Relative %: 52 %
Platelets: 307 10*3/uL (ref 140–400)
RBC: 4.5 MIL/uL (ref 3.80–5.10)
RDW: 13.8 % (ref 11.0–15.0)
WBC: 4.6 10*3/uL (ref 3.8–10.8)

## 2017-01-15 LAB — TSH: TSH: 1.12 mIU/L

## 2017-01-16 LAB — VITAMIN D 25 HYDROXY (VIT D DEFICIENCY, FRACTURES): VIT D 25 HYDROXY: 24 ng/mL — AB (ref 30–100)

## 2017-01-16 NOTE — Patient Instructions (Signed)
Take 2000 units vitamin D 3 daily. It was a pleasure to see you today. Return in one year or as needed.

## 2017-01-16 NOTE — Progress Notes (Signed)
   Subjective:    Patient ID: Doris Brown, female    DOB: 14-Nov-1955, 61 y.o.   MRN: [id number]  HPI 61 year old White Female in today for health maintenance exam and evaluation of medical issues. General health as excellent. She had some wellness labs done through her insurance company. Total cholesterol was 218 with an LDL cholesterol of 108, HDL cholesterol of 99 and triglycerides of 53. Serum glucose was 101. BMI was 26.5.  She had colonoscopy in 2010. Tetanus immunization done in 2014. Gets annual flu vaccine. Had mammogram July 2017. Has GYN physician.  No chronic medications.  She is allergic to Sulfa. It causes a rash.  GYN exam done through [facility].  In October 2014, she saw Dr. Pernell Dupre for palpitations and had a normal exercise tolerance test.  She had a cholecystectomy around 2004. She had a D and E in 1989. 3 pregnancies and one miscarriage.  Social history: She has 2 adult sons from first marriage. Husband is a Music therapist. Patient has a Scientist, water quality. Does not smoke. Drinks 1 or 2 glasses of wine on the weekends.  Family history: Father died at age 68 of a benign brain tumor. Mother living in good health has glaucoma. One sister in good health.    Review of Systems  Constitutional: Negative.   All other systems reviewed and are negative.      Objective:   Physical Exam  Constitutional: She is oriented to person, place, and time. She appears well-developed and well-nourished. No distress.  HENT:  Head: Normocephalic and atraumatic.  Right Ear: External ear normal.  Left Ear: External ear normal.  Mouth/Throat: No oropharyngeal exudate.  Eyes: Conjunctivae and EOM are normal. Pupils are equal, round, and reactive to light. Right eye exhibits no discharge. Left eye exhibits no discharge. No scleral icterus.  Neck: Neck supple. No JVD present. No thyromegaly present.  Cardiovascular: Normal rate, regular rhythm, normal heart sounds and intact distal  pulses.   No murmur heard. Pulmonary/Chest: Effort normal and breath sounds normal. No respiratory distress. She has no wheezes. She has no rales.  Abdominal: Soft. Bowel sounds are normal. She exhibits no distension and no mass. There is no tenderness. There is no rebound and no guarding.  Genitourinary:  Genitourinary Comments: Deferred to GYN physician  Musculoskeletal: She exhibits no edema.  Lymphadenopathy:    She has no cervical adenopathy.  Neurological: She is alert and oriented to person, place, and time. She has normal reflexes. No cranial nerve deficit. Coordination normal.  Skin: Skin is warm and dry. No rash noted. She is not diaphoretic.  Psychiatric: She has a normal mood and affect. Her behavior is normal. Judgment and thought content normal.  Vitals reviewed.         Assessment & Plan:  Normal health maintenance exam   Plan: Drew CBC, C met TSH and vitamin D today. Return in one year or as needed.  Addendum: Vitamin D level is low at 24. Recommend 2000 units vitamin D 3 daily. CBC, TSH and see met are within normal limits.

## 2017-03-10 ENCOUNTER — Other Ambulatory Visit: Payer: Self-pay | Admitting: Obstetrics and Gynecology

## 2017-03-10 DIAGNOSIS — Z1231 Encounter for screening mammogram for malignant neoplasm of breast: Secondary | ICD-10-CM

## 2017-04-16 ENCOUNTER — Ambulatory Visit
Admission: RE | Admit: 2017-04-16 | Discharge: 2017-04-16 | Disposition: A | Discharge status: Home / Self Care | Payer: BLUE CROSS/BLUE SHIELD | Source: Ambulatory Visit | Attending: Obstetrics and Gynecology | Admitting: Obstetrics and Gynecology

## 2017-04-16 DIAGNOSIS — Z1231 Encounter for screening mammogram for malignant neoplasm of breast: Secondary | ICD-10-CM

## 2017-11-25 ENCOUNTER — Other Ambulatory Visit: Payer: Self-pay | Admitting: Internal Medicine

## 2017-11-25 DIAGNOSIS — Z1329 Encounter for screening for other suspected endocrine disorder: Secondary | ICD-10-CM

## 2017-11-25 DIAGNOSIS — E559 Vitamin D deficiency, unspecified: Secondary | ICD-10-CM

## 2017-11-25 DIAGNOSIS — Z0000 Encounter for general adult medical examination without abnormal findings: Secondary | ICD-10-CM

## 2017-11-25 DIAGNOSIS — E7800 Pure hypercholesterolemia, unspecified: Secondary | ICD-10-CM

## 2017-12-07 ENCOUNTER — Other Ambulatory Visit: Payer: BLUE CROSS/BLUE SHIELD | Admitting: Internal Medicine

## 2017-12-08 ENCOUNTER — Other Ambulatory Visit: Payer: 59 | Admitting: Internal Medicine

## 2017-12-08 DIAGNOSIS — E559 Vitamin D deficiency, unspecified: Secondary | ICD-10-CM

## 2017-12-08 DIAGNOSIS — E7800 Pure hypercholesterolemia, unspecified: Secondary | ICD-10-CM

## 2017-12-08 DIAGNOSIS — Z0000 Encounter for general adult medical examination without abnormal findings: Secondary | ICD-10-CM

## 2017-12-08 DIAGNOSIS — Z1329 Encounter for screening for other suspected endocrine disorder: Secondary | ICD-10-CM

## 2017-12-09 LAB — TSH: TSH: 1.18 m[IU]/L (ref 0.40–4.50)

## 2017-12-09 LAB — COMPLETE METABOLIC PANEL WITH GFR
AG Ratio: 1.4 (calc) (ref 1.0–2.5)
ALBUMIN MSPROF: 4.2 g/dL (ref 3.6–5.1)
ALKALINE PHOSPHATASE (APISO): 55 U/L (ref 33–130)
ALT: 10 U/L (ref 6–29)
AST: 15 U/L (ref 10–35)
BUN: 17 mg/dL (ref 7–25)
CALCIUM: 9.5 mg/dL (ref 8.6–10.4)
CO2: 30 mmol/L (ref 20–32)
Chloride: 104 mmol/L (ref 98–110)
Creat: 0.75 mg/dL (ref 0.50–1.05)
GFR, EST AFRICAN AMERICAN: 101 mL/min/{1.73_m2} (ref >=60)
GFR, Est Non African American: 87 mL/min/{1.73_m2} (ref >=60)
GLUCOSE: 105 mg/dL — AB (ref 65–99)
Globulin: 2.9 g/dL (calc) (ref 1.9–3.7)
Potassium: 4.2 mmol/L (ref 3.5–5.3)
Sodium: 141 mmol/L (ref 135–146)
TOTAL PROTEIN: 7.1 g/dL (ref 6.1–8.1)
Total Bilirubin: 0.6 mg/dL (ref 0.2–1.2)

## 2017-12-09 LAB — CBC WITH DIFFERENTIAL/PLATELET
Basophils Absolute: 52 cells/uL (ref 0–200)
Basophils Relative: 1.3 %
Eosinophils Absolute: 32 cells/uL (ref 15–500)
Eosinophils Relative: 0.8 %
HEMATOCRIT: 40.6 % (ref 35.0–45.0)
Hemoglobin: 13.8 g/dL (ref 11.7–15.5)
Lymphs Abs: 1172 cells/uL (ref 850–3900)
MCH: 30.3 pg (ref 27.0–33.0)
MCHC: 34 g/dL (ref 32.0–36.0)
MCV: 89.2 fL (ref 80.0–100.0)
MPV: 9.8 fL (ref 7.5–12.5)
Monocytes Relative: 7.1 %
NEUTROS PCT: 61.5 %
Neutro Abs: 2460 cells/uL (ref 1500–7800)
Platelets: 302 10*3/uL (ref 140–400)
RBC: 4.55 10*6/uL (ref 3.80–5.10)
RDW: 13 % (ref 11.0–15.0)
Total Lymphocyte: 29.3 %
WBC: 4 10*3/uL (ref 3.8–10.8)
WBCMIX: 284 {cells}/uL (ref 200–950)

## 2017-12-09 LAB — LIPID PANEL
CHOLESTEROL: 236 mg/dL — AB (ref <200)
HDL: 81 mg/dL (ref >50)
LDL Cholesterol (Calc): 138 mg/dL (calc) — ABNORMAL HIGH
Non-HDL Cholesterol (Calc): 155 mg/dL (calc) — ABNORMAL HIGH (ref <130)
Total CHOL/HDL Ratio: 2.9 (calc) (ref <5.0)
Triglycerides: 74 mg/dL (ref <150)

## 2017-12-09 LAB — VITAMIN D 25 HYDROXY (VIT D DEFICIENCY, FRACTURES): VIT D 25 HYDROXY: 20 ng/mL — AB (ref 30–100)

## 2017-12-18 ENCOUNTER — Ambulatory Visit (INDEPENDENT_AMBULATORY_CARE_PROVIDER_SITE_OTHER): Payer: 59 | Admitting: Internal Medicine

## 2017-12-18 ENCOUNTER — Encounter: Payer: Self-pay | Admitting: Internal Medicine

## 2017-12-18 VITALS — BP 110/80 | HR 82 | Ht 66.25 in | Wt 166.0 lb

## 2017-12-18 DIAGNOSIS — R739 Hyperglycemia, unspecified: Secondary | ICD-10-CM

## 2017-12-18 DIAGNOSIS — E559 Vitamin D deficiency, unspecified: Secondary | ICD-10-CM | POA: Diagnosis not present

## 2017-12-18 DIAGNOSIS — Z0000 Encounter for general adult medical examination without abnormal findings: Secondary | ICD-10-CM | POA: Diagnosis not present

## 2017-12-18 DIAGNOSIS — E7800 Pure hypercholesterolemia, unspecified: Secondary | ICD-10-CM

## 2017-12-18 LAB — POCT URINALYSIS DIPSTICK
Appearance: NORMAL
Bilirubin, UA: NEGATIVE
Blood, UA: NEGATIVE
Glucose, UA: NEGATIVE
KETONES UA: NEGATIVE
LEUKOCYTES UA: NEGATIVE
NITRITE UA: NEGATIVE
ODOR: NORMAL
PH UA: 6 (ref 5.0–8.0)
PROTEIN UA: NEGATIVE
SPEC GRAV UA: 1.025 (ref 1.010–1.025)
UROBILINOGEN UA: 0.2 U/dL

## 2017-12-18 MED ORDER — ERGOCALCIFEROL 1.25 MG (50000 UT) PO CAPS: 50000.0000 [IU] | ORAL_CAPSULE | ORAL | 11 refills | Status: DC

## 2017-12-18 NOTE — Patient Instructions (Addendum)
Take 50,000 units  Drisdol  weekly for 12 weeks then 2000 units vitamin D3 daily.  Hemoglobin A1c drawn due to elevated serum glucose.  Results are pending with further instructions to follow.  It was a pleasure to see you today.    Please work on exercise and low-fat diet.

## 2017-12-18 NOTE — Progress Notes (Signed)
Subjective:    Patient ID: Doris Brown, female    DOB: November 15, 1955, 62 y.o.   MRN: [id number]  HPI 62 year old Female in today for health maintenance exam and evaluation of medical issues.  She had fasting labs done prior to her appointment today and has a low vitamin D level of 20 and  will take high-dose vitamin D supplement 50,000 units weekly for 12 weeks then 2000 units vitamin D3 daily.  Her cholesterol is elevated to 236 and previously was 194.  HDL is 81.  Triglycerides 74.  LDL is 138 and previously was 108.  She will try to work on diet and exercise over the next few weeks.  TSH is normal.  CBC and C met are normal with the exception of fasting glucose of 105.  Hemoglobin A1c drawn today.  Her weight is actually 3 pounds less than last year but previously she weighed 155 pounds in 2015.  Today weighs 166 pounds.  She travels a lot with her work.  [place].  Works as a Programmer, applications.  No chronic medications.  She is allergic to sulfa.  It causes a rash.  GYN exam done through [facility].  Had tetanus immunization in 2014.  Gets annual flu vaccine.  She had colonoscopy in 2010.  In October 2014 she saw Dr. Pernell Dupre for palpitations and had a normal exercise tolerance test.  She had cholecystectomy around 2004.  She had a D&E in 1989.  3 pregnancies and 1 miscarriage.  Social history: 2 adult sons from first marriage.  Husband is a Music therapist.  She has a Scientist, water quality.  Does not smoke.  Rates 1 or 2 glasses of wine on weekends.  Family history: Father died at age 59 of a benign brain tumor.  Mother living in good health with history of glaucoma.  One sister in good health.    Review of Systems  Constitutional: Negative.   All other systems reviewed and are negative.      Objective:   Physical Exam  Constitutional: She is oriented to person, place, and time. She appears well-developed and  well-nourished. No distress.  HENT:  Head: Normocephalic and atraumatic.  Right Ear: External ear normal.  Left Ear: External ear normal.  Mouth/Throat: Oropharynx is clear and moist.  Eyes: Pupils are equal, round, and reactive to light. Conjunctivae and EOM are normal. Right eye exhibits no discharge. Left eye exhibits no discharge. No scleral icterus.  Neck: Neck supple. No JVD present. No thyromegaly present.  Cardiovascular: Normal rate, regular rhythm, normal heart sounds and intact distal pulses.  No murmur heard. Pulmonary/Chest: Effort normal and breath sounds normal. No respiratory distress. She has no wheezes. She has no rales.  Breasts normal female without masses  Abdominal: She exhibits no distension and no mass. There is no tenderness. There is no rebound and no guarding.  Genitourinary:  Genitourinary Comments: Deferred to GYN physician  Musculoskeletal: She exhibits no edema.  Lymphadenopathy:    She has no cervical adenopathy.  Neurological: She is alert and oriented to person, place, and time. She has normal reflexes. No cranial nerve deficit. Coordination normal.  Skin: Skin is warm and dry. No rash noted. She is not diaphoretic.  Psychiatric: She has a normal mood and affect. Her behavior is normal. Judgment and thought content normal.  Vitals reviewed.         Assessment & Plan:  Vitamin D deficiency-take 50,000 units of Drisdol weekly for 12 weeks and 2000 units vitamin D3 daily  Pure hypercholesterolemia-work on diet exercise and weight loss can follow-up in 6-12 months with lipid panel  Elevated serum glucose-drew hemoglobin A1c today

## 2017-12-19 LAB — HEMOGLOBIN A1C
EAG (MMOL/L): 5.5 (calc)
HEMOGLOBIN A1C: 5.1 %{Hb} (ref <5.7)
MEAN PLASMA GLUCOSE: 100 (calc)

## 2018-03-29 ENCOUNTER — Other Ambulatory Visit: Payer: Self-pay | Admitting: Obstetrics and Gynecology

## 2018-03-29 DIAGNOSIS — Z1231 Encounter for screening mammogram for malignant neoplasm of breast: Secondary | ICD-10-CM

## 2018-04-16 ENCOUNTER — Ambulatory Visit
Admission: RE | Admit: 2018-04-16 | Discharge: 2018-04-16 | Disposition: A | Discharge status: Home / Self Care | Payer: BLUE CROSS/BLUE SHIELD | Source: Ambulatory Visit | Attending: Obstetrics and Gynecology | Admitting: Obstetrics and Gynecology

## 2018-04-16 DIAGNOSIS — Z1231 Encounter for screening mammogram for malignant neoplasm of breast: Secondary | ICD-10-CM

## 2018-11-17 ENCOUNTER — Other Ambulatory Visit: Payer: Self-pay | Admitting: Obstetrics and Gynecology

## 2018-11-18 ENCOUNTER — Other Ambulatory Visit: Payer: Self-pay | Admitting: Obstetrics and Gynecology

## 2018-11-18 ENCOUNTER — Encounter: Payer: Self-pay | Admitting: Internal Medicine

## 2018-11-18 DIAGNOSIS — M8580 Other specified disorders of bone density and structure, unspecified site: Secondary | ICD-10-CM

## 2018-11-22 ENCOUNTER — Other Ambulatory Visit: Payer: Self-pay

## 2018-11-22 ENCOUNTER — Ambulatory Visit (AMBULATORY_SURGERY_CENTER): Payer: Self-pay | Admitting: *Deleted

## 2018-11-22 ENCOUNTER — Encounter: Payer: Self-pay | Admitting: Internal Medicine

## 2018-11-22 VITALS — Ht 67.0 in | Wt 177.8 lb

## 2018-11-22 DIAGNOSIS — Z1211 Encounter for screening for malignant neoplasm of colon: Secondary | ICD-10-CM

## 2018-11-22 NOTE — Progress Notes (Signed)
No egg or soy allergy known to patient  No issues with past sedation with any surgeries  or procedures, no intubation problems  No diet pills per patient No home 02 use per patient  No blood thinners per patient  Pt denies issues with constipation  No A fib or A flutter  EMMI video sent to pt's e mail  

## 2018-11-30 ENCOUNTER — Encounter: Payer: Self-pay | Admitting: Internal Medicine

## 2018-11-30 ENCOUNTER — Other Ambulatory Visit: Payer: Self-pay

## 2018-11-30 ENCOUNTER — Ambulatory Visit (AMBULATORY_SURGERY_CENTER): Payer: 59 | Admitting: Internal Medicine

## 2018-11-30 VITALS — BP 105/72 | HR 72 | Temp 99.8°F | Resp 15 | Ht 66.0 in | Wt 166.0 lb

## 2018-11-30 DIAGNOSIS — D125 Benign neoplasm of sigmoid colon: Secondary | ICD-10-CM

## 2018-11-30 DIAGNOSIS — K635 Polyp of colon: Secondary | ICD-10-CM | POA: Diagnosis not present

## 2018-11-30 DIAGNOSIS — Z1211 Encounter for screening for malignant neoplasm of colon: Secondary | ICD-10-CM | POA: Diagnosis present

## 2018-11-30 MED ORDER — SODIUM CHLORIDE 0.9 % IV SOLN: 500.0000 mL | Freq: Once | INTRAVENOUS | Status: DC

## 2018-11-30 NOTE — Op Note (Signed)
[facility] Patient Name: Doris Brown Procedure Date: 11/30/2018 2:12 PM MRN: [id number] Endoscopist: Gatha Mayer , MD Age: 63 Referring MD:  Date of Birth: 21-Aug-1956 Gender: Female Account #: [id number] Procedure:                Colonoscopy Indications:              Screening for colorectal malignant neoplasm, Last                            colonoscopy: 2010 Medicines:                Propofol per Anesthesia, Monitored Anesthesia Care Procedure:                Pre-Anesthesia Assessment:                           - Prior to the procedure, a History and Physical                            was performed, and patient medications and                            allergies were reviewed. The patient's tolerance of                            previous anesthesia was also reviewed. The risks                            and benefits of the procedure and the sedation                            options and risks were discussed with the patient.                            All questions were answered, and informed consent                            was obtained. Prior Anticoagulants: The patient has                            taken no previous anticoagulant or antiplatelet                            agents. ASA Grade Assessment: II - A patient with                            mild systemic disease. After reviewing the risks                            and benefits, the patient was deemed in                            satisfactory condition to undergo the procedure.  After obtaining informed consent, the colonoscope                            was passed under direct vision. Throughout the                            procedure, the patient's blood pressure, pulse, and                            oxygen saturations were monitored continuously. The                            Colonoscope was introduced through the anus and                            advanced to the the  cecum, identified by                            appendiceal orifice and ileocecal valve. The                            colonoscopy was performed without difficulty. The                            patient tolerated the procedure well. The quality                            of the bowel preparation was good. The bowel                            preparation used was Miralax. The ileocecal valve,                            appendiceal orifice, and rectum were photographed. Scope In: 2:19:16 PM Scope Out: 2:39:34 PM Scope Withdrawal Time: 0 hours 16 minutes 55 seconds  Total Procedure Duration: 0 hours 20 minutes 18 seconds  Findings:                 The perianal and digital rectal examinations were                            normal.                           A diminutive polyp was found in the sigmoid colon.                            The polyp was sessile. The polyp was removed with a                            cold snare. Resection and retrieval were complete.                            Verification of patient identification for the  specimen was done. Estimated blood loss was minimal.                           The exam was otherwise without abnormality on                            direct and retroflexion views. Complications:            No immediate complications. Estimated Blood Loss:     Estimated blood loss was minimal. Impression:               - One diminutive polyp in the sigmoid colon,                            removed with a cold snare. Resected and retrieved.                           - The examination was otherwise normal on direct                            and retroflexion views. Recommendation:           - Patient has a contact number available for                            emergencies. The signs and symptoms of potential                            delayed complications were discussed with the                            patient. Return to normal  activities tomorrow.                            Written discharge instructions were provided to the                            patient.                           - Resume previous diet.                           - Continue present medications.                           - Repeat colonoscopy is recommended. The                            colonoscopy date will be determined after pathology                            results from today's exam become available for                            review. Gatha Mayer, MD 11/30/2018 2:50:38 PM This report  has been signed electronically.

## 2018-11-30 NOTE — Progress Notes (Signed)
No problems noted in the recovery room. maw 

## 2018-11-30 NOTE — Progress Notes (Signed)
Called to room to assist during endoscopic procedure.  Patient ID and intended procedure confirmed with present staff. Received instructions for my participation in the procedure from the performing physician.  

## 2018-11-30 NOTE — Patient Instructions (Addendum)
I found and removed one tiny polyp.  I will let you know pathology results and when to have another routine colonoscopy by mail and/or My Chart.  I appreciate the opportunity to care for you. Gatha Mayer, MD, FACG     YOU HAD AN ENDOSCOPIC PROCEDURE TODAY AT [facility]:   Refer to the procedure report that was given to you for any specific questions about what was found during the examination.  If the procedure report does not answer your questions, please call your gastroenterologist to clarify.  If you requested that your care partner not be given the details of your procedure findings, then the procedure report has been included in a sealed envelope for you to review at your convenience later.  YOU SHOULD EXPECT: Some feelings of bloating in the abdomen. Passage of more gas than usual.  Walking can help get rid of the air that was put into your GI tract during the procedure and reduce the bloating. If you had a lower endoscopy (such as a colonoscopy or flexible sigmoidoscopy) you may notice spotting of blood in your stool or on the toilet paper. If you underwent a bowel prep for your procedure, you may not have a normal bowel movement for a few days.  Please Note:  You might notice some irritation and congestion in your nose or some drainage.  This is from the oxygen used during your procedure.  There is no need for concern and it should clear up in a day or so.  SYMPTOMS TO REPORT IMMEDIATELY:   Following lower endoscopy (colonoscopy or flexible sigmoidoscopy):  Excessive amounts of blood in the stool  Significant tenderness or worsening of abdominal pains  Swelling of the abdomen that is new, acute  Fever of 100F or higher   For urgent or emergent issues, a gastroenterologist can be reached at any hour by calling [phone].   DIET:  We do recommend a small meal at first, but then you may proceed to your regular diet.  Drink plenty of fluids but  you should avoid alcoholic beverages for 24 hours.  ACTIVITY:  You should plan to take it easy for the rest of today and you should NOT DRIVE or use heavy machinery until tomorrow (because of the sedation medicines used during the test).    FOLLOW UP: Our staff will call the number listed on your records the next business day following your procedure to check on you and address any questions or concerns that you may have regarding the information given to you following your procedure. If we do not reach you, we will leave a message.  However, if you are feeling well and you are not experiencing any problems, there is no need to return our call.  We will assume that you have returned to your regular daily activities without incident.  If any biopsies were taken you will be contacted by phone or by letter within the next 1-3 weeks.  Please call us at [phone] if you have not heard about the biopsies in 3 weeks.    SIGNATURES/CONFIDENTIALITY: You and/or your care partner have signed paperwork which will be entered into your electronic medical record.  These signatures attest to the fact that that the information above on your After Visit Summary has been reviewed and is understood.  Full responsibility of the confidentiality of this discharge information lies with you and/or your care-partner.    Handou was given to your care partner  on polyps. You may resume your current medications today. Await biopsy results. Please call if any questions or concerns.

## 2018-11-30 NOTE — Progress Notes (Signed)
Spontaneous respirations throughout. VSS. Resting comfortably. To PACU on room air. Report to  RN. 

## 2018-12-01 ENCOUNTER — Telehealth: Payer: Self-pay

## 2018-12-01 NOTE — Telephone Encounter (Signed)
Left message, will try again later today.

## 2018-12-01 NOTE — Telephone Encounter (Signed)
Left message, 2nd call.

## 2018-12-04 ENCOUNTER — Encounter: Payer: Self-pay | Admitting: Internal Medicine

## 2018-12-04 NOTE — Progress Notes (Signed)
Distal hyperplastic polyp 10 year recall  My chart

## 2018-12-08 ENCOUNTER — Ambulatory Visit
Admission: RE | Admit: 2018-12-08 | Discharge: 2018-12-08 | Disposition: A | Discharge status: Home / Self Care | Payer: 59 | Source: Ambulatory Visit | Attending: Obstetrics and Gynecology | Admitting: Obstetrics and Gynecology

## 2018-12-08 ENCOUNTER — Other Ambulatory Visit: Payer: Self-pay

## 2018-12-08 DIAGNOSIS — M8580 Other specified disorders of bone density and structure, unspecified site: Secondary | ICD-10-CM

## 2019-05-23 ENCOUNTER — Other Ambulatory Visit: Payer: Self-pay | Admitting: Obstetrics and Gynecology

## 2019-05-23 DIAGNOSIS — Z1231 Encounter for screening mammogram for malignant neoplasm of breast: Secondary | ICD-10-CM

## 2019-07-12 ENCOUNTER — Other Ambulatory Visit: Payer: Self-pay

## 2019-07-12 ENCOUNTER — Ambulatory Visit
Admission: RE | Admit: 2019-07-12 | Discharge: 2019-07-12 | Disposition: A | Discharge status: Home / Self Care | Payer: 59 | Source: Ambulatory Visit | Attending: Obstetrics and Gynecology | Admitting: Obstetrics and Gynecology

## 2019-07-12 DIAGNOSIS — Z1231 Encounter for screening mammogram for malignant neoplasm of breast: Secondary | ICD-10-CM

## 2019-10-05 ENCOUNTER — Telehealth: Payer: Self-pay | Admitting: Plastic Surgery

## 2019-10-05 NOTE — Telephone Encounter (Signed)

## 2019-10-06 ENCOUNTER — Encounter: Payer: Self-pay | Admitting: Plastic Surgery

## 2019-10-06 ENCOUNTER — Ambulatory Visit (INDEPENDENT_AMBULATORY_CARE_PROVIDER_SITE_OTHER): Payer: Self-pay | Admitting: Plastic Surgery

## 2019-10-06 ENCOUNTER — Other Ambulatory Visit: Payer: Self-pay

## 2019-10-06 VITALS — BP 105/68 | HR 83 | Temp 98.2°F | Ht 67.0 in | Wt 175.0 lb

## 2019-10-06 DIAGNOSIS — Z411 Encounter for cosmetic surgery: Secondary | ICD-10-CM

## 2019-10-06 NOTE — Progress Notes (Signed)
   Referring Provider Baxley, Cresenciano Lick, MD [address] [place],   [id number]   CC:  Chief Complaint  Patient presents with  . Advice Only  Torn left earlobe  Doris Brown is an 64 y.o. female.  HPI: Patient presents with an issue with her left earlobe.  Ear piercing hole was been stretched out and then was torn completely through.  She wants to see if it can be repaired.  It has been repaired once before about 10 years ago.  Allergies  Allergen Reactions  . Sulfa Antibiotics Rash    Outpatient Encounter Medications as of 10/06/2019  Medication Sig  . ergocalciferol (DRISDOL) 50000 units capsule Take 1 capsule (50,000 Units total) by mouth once a week. (Patient not taking: Reported on 11/22/2018)   No facility-administered encounter medications on file as of 10/06/2019.     Past Medical History:  Diagnosis Date  . Chest tightness     Past Surgical History:  Procedure Laterality Date  . CHOLECYSTECTOMY    . COLONOSCOPY    . DILATION AND CURETTAGE OF UTERUS      Family History  Problem Relation Age of Onset  . Breast cancer Sister   . Breast cancer Paternal Aunt   . Colon cancer Neg Hx   . Colon polyps Neg Hx   . Esophageal cancer Neg Hx   . Rectal cancer Neg Hx   . Stomach cancer Neg Hx     Social History   Social History Narrative  . Not on file     Review of Systems General: Denies fevers, chills, weight loss CV: Denies chest pain, shortness of breath, palpitations  Physical Exam Vitals with BMI 10/06/2019 11/30/2018 11/30/2018  Height 5\' 7"  - -  Weight 175 lbs - -  BMI [id number] - -  Systolic [id number]  Diastolic 68 72 64  Pulse 83 72 72    General:  No acute distress,  Alert and oriented, Non-Toxic, Normal speech and affect Examination of the left ear shows near complete tear of the lobe with a small skin bridge remaining.  The right side looks fine  Assessment/Plan Patient presents with a near complete tear of her left earlobe and  desire for correction.  I explained the repair to her in detail and explained the risk that include bleeding, infection, need for additional procedures.  We discussed that we would wait 2 to 3 months before piercing and again.  She understood and will get this scheduled under local.  Cindra Presume 10/06/2019, 9:25 AM

## 2019-10-13 ENCOUNTER — Telehealth: Payer: Self-pay | Admitting: Internal Medicine

## 2019-10-13 NOTE — Telephone Encounter (Signed)
Thank you for letting us know.  

## 2019-10-13 NOTE — Telephone Encounter (Signed)
Patient calls in the morning, asking to do a TOC to Dr.Hunter because her family sees him and she would feel more comfortable transitioning to someone she knows.

## 2019-10-13 NOTE — Telephone Encounter (Signed)
See below

## 2019-10-13 NOTE — Telephone Encounter (Signed)
I am accepting family of current patients- just confirm family member is my patient and I will accept

## 2019-10-17 NOTE — Telephone Encounter (Signed)
[id number]- this is Jerri's husband is seeing you today.

## 2019-10-17 NOTE — Telephone Encounter (Signed)
[id number] this is her mother.

## 2019-10-17 NOTE — Telephone Encounter (Signed)
Yes thanks- definitely will accept

## 2019-10-17 NOTE — Telephone Encounter (Signed)
Pts family listed below.

## 2019-10-17 NOTE — Telephone Encounter (Signed)
Doris Brown [id number] is a patient with Dr.Hunter and this is her son.

## 2019-10-17 NOTE — Telephone Encounter (Signed)
Also meant to send to [facility] team

## 2019-10-20 ENCOUNTER — Other Ambulatory Visit: Payer: Self-pay

## 2019-10-20 ENCOUNTER — Encounter: Payer: Self-pay | Admitting: Plastic Surgery

## 2019-10-20 ENCOUNTER — Ambulatory Visit (INDEPENDENT_AMBULATORY_CARE_PROVIDER_SITE_OTHER): Payer: Self-pay | Admitting: Plastic Surgery

## 2019-10-20 VITALS — BP 112/63 | HR 87 | Temp 97.1°F | Ht 67.0 in | Wt 180.2 lb

## 2019-10-20 DIAGNOSIS — Z411 Encounter for cosmetic surgery: Secondary | ICD-10-CM

## 2019-10-20 NOTE — Progress Notes (Signed)
Operative Note   DATE OF OPERATION: 10/20/2019  LOCATION:    SURGICAL DEPARTMENT: Plastic Surgery  PREOPERATIVE DIAGNOSES: Split left earlobe  POSTOPERATIVE DIAGNOSES:  same  PROCEDURE:  1. Split left earlobe repair  SURGEON: Talmadge Coventry, MD  ANESTHESIA:  Local  COMPLICATIONS: None.   INDICATIONS FOR PROCEDURE:  The patient, Doris Brown is a 64 y.o. female born on 04-14-1956, is here for treatment of split left earlobe MRN: [id number]  CONSENT:  Informed consent was obtained directly from the patient. Risks, benefits and alternatives were fully discussed. Specific risks including but not limited to bleeding, infection, hematoma, seroma, scarring, pain, infection, wound healing problems, and need for further surgery were all discussed. The patient did have an ample opportunity to have questions answered to satisfaction.   DESCRIPTION OF PROCEDURE:  Local anesthesia was administered. The patient's operative site was prepped and draped in a sterile fashion. A time out was performed and all information was confirmed to be correct.  Split portion of the lobe was excised with an 11 blade.  This was then repaired with a mattress Prolene at the rim of the lobule combined with interrupted sutures on the anterior and posterior aspect of the lobe.  This gave a nice on table result  The patient tolerated the procedure well.  There were no complications.

## 2019-11-02 ENCOUNTER — Other Ambulatory Visit: Payer: Self-pay

## 2019-11-02 ENCOUNTER — Encounter: Payer: Self-pay | Admitting: Surgical

## 2019-11-02 ENCOUNTER — Ambulatory Visit (INDEPENDENT_AMBULATORY_CARE_PROVIDER_SITE_OTHER): Payer: 59 | Admitting: Surgical

## 2019-11-02 VITALS — BP 109/74 | HR 78 | Temp 97.7°F | Ht 67.0 in | Wt 180.0 lb

## 2019-11-02 DIAGNOSIS — Z411 Encounter for cosmetic surgery: Secondary | ICD-10-CM

## 2019-11-02 NOTE — Progress Notes (Signed)
The patient is a 64 year old female here for follow-up after repair of left split earlobe on 10/20/2019 with Dr. Silverio Lay pace.  She reports she is doing really well.  Her incision and repair is healing well.  Prolene sutures removed today.  No complaints other than minor issues with sleeping due to tugging of sutures on pillow.  Follow-up in 2 months for repiercing of left ear.  Call with any questions or concerns.

## 2019-11-29 ENCOUNTER — Other Ambulatory Visit: Payer: Self-pay

## 2019-11-29 ENCOUNTER — Encounter: Payer: Self-pay | Admitting: Family Medicine

## 2019-11-29 ENCOUNTER — Ambulatory Visit (INDEPENDENT_AMBULATORY_CARE_PROVIDER_SITE_OTHER): Payer: 59 | Admitting: Family Medicine

## 2019-11-29 VITALS — BP 120/78 | HR 97 | Temp 98.5°F | Ht 67.0 in | Wt 169.8 lb

## 2019-11-29 DIAGNOSIS — E559 Vitamin D deficiency, unspecified: Secondary | ICD-10-CM | POA: Diagnosis not present

## 2019-11-29 DIAGNOSIS — E785 Hyperlipidemia, unspecified: Secondary | ICD-10-CM | POA: Diagnosis not present

## 2019-11-29 DIAGNOSIS — Z0000 Encounter for general adult medical examination without abnormal findings: Secondary | ICD-10-CM | POA: Diagnosis not present

## 2019-11-29 LAB — LIPID PANEL
Cholesterol: 236 mg/dL — ABNORMAL HIGH (ref 0–200)
HDL: 89.6 mg/dL (ref >39.00)
LDL Cholesterol: 125 mg/dL — ABNORMAL HIGH (ref 0–99)
NonHDL: 146.41
Total CHOL/HDL Ratio: 3
Triglycerides: 106 mg/dL (ref 0.0–149.0)
VLDL: 21.2 mg/dL (ref 0.0–40.0)

## 2019-11-29 LAB — CBC WITH DIFFERENTIAL/PLATELET
Basophils Absolute: 0.1 10*3/uL (ref 0.0–0.1)
Basophils Relative: 1.1 % (ref 0.0–3.0)
Eosinophils Absolute: 0 10*3/uL (ref 0.0–0.7)
Eosinophils Relative: 0.4 % (ref 0.0–5.0)
HCT: 41.6 % (ref 36.0–46.0)
Hemoglobin: 13.9 g/dL (ref 12.0–15.0)
Lymphocytes Relative: 30.2 % (ref 12.0–46.0)
Lymphs Abs: 1.6 10*3/uL (ref 0.7–4.0)
MCHC: 33.4 g/dL (ref 30.0–36.0)
MCV: 92.5 fl (ref 78.0–100.0)
Monocytes Absolute: 0.4 10*3/uL (ref 0.1–1.0)
Monocytes Relative: 7.9 % (ref 3.0–12.0)
Neutro Abs: 3.3 10*3/uL (ref 1.4–7.7)
Neutrophils Relative %: 60.4 % (ref 43.0–77.0)
Platelets: 284 10*3/uL (ref 150.0–400.0)
RBC: 4.5 Mil/uL (ref 3.87–5.11)
RDW: 13.6 % (ref 11.5–15.5)
WBC: 5.4 10*3/uL (ref 4.0–10.5)

## 2019-11-29 LAB — COMPREHENSIVE METABOLIC PANEL
ALT: 18 U/L (ref 0–35)
AST: 21 U/L (ref 0–37)
Albumin: 4.4 g/dL (ref 3.5–5.2)
Alkaline Phosphatase: 53 U/L (ref 39–117)
BUN: 15 mg/dL (ref 6–23)
CO2: 30 mEq/L (ref 19–32)
Calcium: 9.8 mg/dL (ref 8.4–10.5)
Chloride: 101 mEq/L (ref 96–112)
Creatinine, Ser: 0.68 mg/dL (ref 0.40–1.20)
GFR: 87.68 mL/min (ref >60.00)
Glucose, Bld: 93 mg/dL (ref 70–99)
Potassium: 4.3 mEq/L (ref 3.5–5.1)
Sodium: 139 mEq/L (ref 135–145)
Total Bilirubin: 0.6 mg/dL (ref 0.2–1.2)
Total Protein: 7.4 g/dL (ref 6.0–8.3)

## 2019-11-29 LAB — VITAMIN D 25 HYDROXY (VIT D DEFICIENCY, FRACTURES): VITD: 32.73 ng/mL (ref 30.00–100.00)

## 2019-11-29 LAB — HM PAP SMEAR: HM Pap smear: NORMAL

## 2019-11-29 NOTE — Patient Instructions (Addendum)
Health Maintenance Due  Topic Date Due  . PAP SMEAR- sending for notes had 2 weeks ago  09/18/2019   Consider at least 2 weeks after covid vaccination complete coming back for shingrix for nurse visit or we can do with next physical   Target 1200mg  calcium per day- at least 600 if you can at minimum on average. Also I do think its good to make sure multivitamin has at least 800 units vitamin D.   Please stop by lab before you go If you do not have mychart- we will call you about results within 5 business days of [place] receiving them.  If you have mychart- we will send your results within 3 business days of [place] receiving them.  If abnormal or we want to clarify a result, we will call or mychart you to make sure you receive the message.  If you have questions or concerns or don't hear within 5 business days, please send [place] a message or call us.    Recommended follow up: Return in about 1 year (around 11/28/2020) for physical or sooner if needed.

## 2019-11-29 NOTE — Progress Notes (Signed)
Phone: [phone]   Subjective:  Patient presents today for their annual physical and to establish care as transfer from Dr. Renold Genta Chief complaint-noted.   See problem oriented charting- ROS- full  review of systems was completed and negative except for: occasional palpitation- state has had PVCs in past  The following were reviewed and entered/updated in epic: Past Medical History:  Diagnosis Date  . Chest tightness    stress test in 2010s was reassuring after pain with mowing lawn. mild intermittent not exertiona since that time   Patient Active Problem List   Diagnosis Date Noted  . Hyperlipidemia, unspecified 11/29/2019    Priority: Medium  . Vitamin D deficiency 11/29/2019    Priority: Low  . Allergic rhinitis 05/21/2013    Priority: Low   Past Surgical History:  Procedure Laterality Date  . CHOLECYSTECTOMY    . COLONOSCOPY    . DILATION AND CURETTAGE OF UTERUS      Family History  Problem Relation Age of Onset  . Breast cancer Sister        survived- lumpectomy and radiation  . Other Sister        ovarian growths were not cancerous  . Breast cancer Paternal Aunt   . Osteoporosis Mother   . Glaucoma Mother   . Other Father        was MD. benign brain tumor- cholesteatoma posterior fossa. 3 recurrence and last noted on CT- surgery prior  . Leukemia Maternal Grandmother   . Other Maternal Grandfather        died at age 2  . Lung cancer Paternal Grandmother        nonsmoker- worked in Marketing executive of [employer]  . Parkinson's disease Paternal Grandfather   . Colon cancer Neg Hx   . Colon polyps Neg Hx   . Esophageal cancer Neg Hx   . Rectal cancer Neg Hx   . Stomach cancer Neg Hx     Medications- reviewed and updated Current Outpatient Medications  Medication Sig Dispense Refill  . Multiple Vitamin (MULTIVITAMIN) capsule Take 1 capsule by mouth daily.     No current facility-administered medications for this visit.    Allergies-reviewed and  updated Allergies  Allergen Reactions  . Sulfa Antibiotics Rash    Social History   Social History Narrative   Married. 2 grown children from prior marriage. 3 step children. No grandkids yet.    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    -masters health administration- [place] and nursing at [employer]: place on [employer] (husband big fisherman), reading, cooking    Objective  Objective:  BP 120/78   Pulse 97   Temp 98.5 F (36.9 C) (Temporal)   Ht 5\' 7"  (1.702 m)   Wt 169 lb 12.8 oz (77 kg)   SpO2 99%   BMI 26.59 kg/m  Gen: NAD, resting comfortably HEENT: Mucous membranes are moist. Oropharynx normal Neck: no thyromegaly CV: RRR no murmurs rubs or gallops Lungs: CTAB no crackles, wheeze, rhonchi Abdomen: soft/nontender/nondistended/normal bowel sounds. No rebound or guarding.  Ext: no edema Skin: warm, dry Neuro: grossly normal, moves all extremities, PERRLA   Assessment and Plan   64 y.o. female presenting for annual physical.  Health Maintenance counseling: 1. Anticipatory guidance: Patient counseled regarding regular dental exams -  q6 months, eye exams - yearly,  avoiding smoking and second hand smoke , limiting alcohol to 1 beverage per day .   2. Risk factor reduction:  Advised patient of need for regular exercise and diet rich and fruits and vegetables to reduce risk of heart attack and stroke. Exercise- walking most days. Diet-has done a great job recently- down 11 lbs- has cut down on carbs as also helpful for husband.  Wt Readings from Last 3 Encounters:  11/29/19 169 lb 12.8 oz (77 kg)  11/02/19 180 lb (81.6 kg)  10/20/19 180 lb 3.2 oz (81.7 kg)  3. Immunizations/screenings/ancillary studies- got first covid vaccine- otherwise up to date other than shingrix.  Immunization  History  Administered Date(s) Administered  . Influenza,inj,Quad PF,6+ Mos 06/27/2014, 06/25/2017, 07/08/2018, 06/26/2019  . Moderna SARS-COVID-2 Vaccination 11/18/2019  . Tdap 05/16/2013  4. Cervical cancer screening- just had 2 weeks ago- we are requesting results 5. Breast cancer screening-  breast exam with GYN and mammogram 07/12/2019 had 3d mammogram 6. Colon cancer screening - 11/30/2018 with 10 year repeat planned. Hyperplastic polyp only.  7. Skin cancer screening- Dr. Jarome Matin as needed for dermatology. advised regular sunscreen use. Denies worrisome, changing, or new skin lesions.  8. Birth control/STD check- postmenopausal and monogomous 9. Osteoporosis screening at 89- has had bone density 12/08/18 with GYN and has osteopenia. Takes multivitamin. From avs "Target 1200mg  calcium per day- at least 600 if you can at minimum on average. Also I do think its good to make sure multivitamin has at least 800 units vitamin D. " - never smoker  Status of chronic or acute concerns   Vitamin D deficiency  - takes MV- update vitamin D today  Hyperlipidemia, unspecified hyperlipidemia type - Plan: CBC with Differential/Platelet, Comprehensive metabolic panel, Lipid panel - 10 year ascvd risk under 3%- will update labs- focus on healthy eating/regular exercise- has had recent weight loss and congratulated  Recommended follow up: Return in about 1 year (around 11/28/2020) for physical or sooner if needed. Future Appointments  Date Time Provider Sims  01/04/2020  8:30 AM Pace, Steffanie Dunn, MD [facility] None    Chief Complaint  Patient presents with  . New Patient (Initial Visit)    TOC from Dr. Renold Genta    Lab/Order associations: not fasting   ICD-10-CM   1. Preventative health care  Z00.00 Multiple Vitamin (MULTIVITAMIN) capsule    CBC with Differential/Platelet    Comprehensive metabolic panel    Lipid panel    VITAMIN D 25 Hydroxy (Vit-D Deficiency, Fractures)  2. Vitamin D  deficiency  E55.9 VITAMIN D 25 Hydroxy (Vit-D Deficiency, Fractures)  3. Hyperlipidemia, unspecified hyperlipidemia type  E78.5 CBC with Differential/Platelet    Comprehensive metabolic panel    Lipid panel   Return precautions advised.  Garret Reddish, MD

## 2019-11-30 ENCOUNTER — Encounter: Payer: Self-pay | Admitting: Family Medicine

## 2020-01-04 ENCOUNTER — Other Ambulatory Visit: Payer: Self-pay

## 2020-01-04 ENCOUNTER — Ambulatory Visit (INDEPENDENT_AMBULATORY_CARE_PROVIDER_SITE_OTHER): Payer: Self-pay | Admitting: Plastic Surgery

## 2020-01-04 ENCOUNTER — Encounter: Payer: Self-pay | Admitting: Plastic Surgery

## 2020-01-04 VITALS — BP 99/66 | HR 79 | Temp 97.1°F | Ht 66.0 in | Wt 163.0 lb

## 2020-01-04 DIAGNOSIS — Z411 Encounter for cosmetic surgery: Secondary | ICD-10-CM

## 2020-01-04 NOTE — Progress Notes (Signed)
Patient is here for piercing of her left earlobe after repair of a left earlobe tear.  Suitable location was identified and confirmed with the patient to suture her.  The area was then cleansed with an alcohol pad and the piercing gun was used to fire a stud earring at the desired location.  She tolerated this well.  Instructions were given for managing the piercing.  She can follow-up as needed.

## 2020-04-18 ENCOUNTER — Encounter: Payer: Self-pay | Admitting: Family Medicine

## 2020-05-24 ENCOUNTER — Other Ambulatory Visit: Payer: Self-pay | Admitting: Obstetrics and Gynecology

## 2020-05-24 DIAGNOSIS — Z1231 Encounter for screening mammogram for malignant neoplasm of breast: Secondary | ICD-10-CM

## 2020-07-12 ENCOUNTER — Ambulatory Visit
Admission: RE | Admit: 2020-07-12 | Discharge: 2020-07-12 | Disposition: A | Discharge status: Home / Self Care | Payer: 59 | Source: Ambulatory Visit | Attending: Obstetrics and Gynecology | Admitting: Obstetrics and Gynecology

## 2020-07-12 ENCOUNTER — Other Ambulatory Visit: Payer: Self-pay

## 2020-07-12 DIAGNOSIS — Z1231 Encounter for screening mammogram for malignant neoplasm of breast: Secondary | ICD-10-CM

## 2021-01-21 NOTE — Patient Instructions (Incomplete)
Health Maintenance Due  Topic Date Due  . COVID-19 Vaccine (2 - Moderna 3-dose series) 12/16/2019   Please stop by lab before you go If you have mychart- we will send your results within 3 business days of [place] receiving them.  If you do not have mychart- we will call you about results within 5 business days of [place] receiving them.  *please also note that you will see labs on mychart as soon as they post. I will later go in and write notes on them- will say "notes from Dr. Yong Channel"   Depression screen [facility] 2/9 11/29/2019 12/18/2017 01/15/2017  Decreased Interest 0 0 0  Down, Depressed, Hopeless 0 0 0  PHQ - 2 Score 0 0 0  Altered sleeping 1 - -  Tired, decreased energy 0 - -  Change in appetite 0 - -  Feeling bad or failure about yourself  0 - -  Trouble concentrating 0 - -  Moving slowly or fidgety/restless 0 - -  Suicidal thoughts 0 - -  PHQ-9 Score 1 - -  Difficult doing work/chores Not difficult at all - -    Recommended follow up: No follow-ups on file.

## 2021-01-21 NOTE — Progress Notes (Deleted)
Phone [phone]   Subjective:  Patient presents today for their annual physical. Chief complaint-noted.   See problem oriented charting- ROS- full  review of systems was completed and negative except for: ***  The following were reviewed and entered/updated in epic: Past Medical History:  Diagnosis Date  . Chest tightness    stress test in 2010s was reassuring after pain with mowing lawn. mild intermittent not exertiona since that time   Patient Active Problem List   Diagnosis Date Noted  . Vitamin D deficiency 11/29/2019  . Hyperlipidemia, unspecified 11/29/2019  . Allergic rhinitis 05/21/2013   Past Surgical History:  Procedure Laterality Date  . CHOLECYSTECTOMY    . COLONOSCOPY  04/22/2016   CIN1 after an ascus  . DILATION AND CURETTAGE OF UTERUS    . WISDOM TOOTH EXTRACTION      Family History  Problem Relation Age of Onset  . Breast cancer Sister        survived- lumpectomy and radiation  . Other Sister        ovarian growths were not cancerous  . Breast cancer Paternal Aunt   . Pancreatic cancer Paternal Aunt   . Osteoporosis Mother   . Glaucoma Mother   . Other Father        was MD. benign brain tumor- cholesteatoma posterior fossa. 3 recurrence and last noted on CT- surgery prior  . Leukemia Maternal Grandmother   . Hypertension Maternal Grandmother   . Other Maternal Grandfather        died at age 46  . Lung cancer Paternal Grandmother        nonsmoker- worked in Marketing executive of [employer]  . Parkinson's disease Paternal Grandfather   . Colon cancer Neg Hx   . Colon polyps Neg Hx   . Esophageal cancer Neg Hx   . Rectal cancer Neg Hx   . Stomach cancer Neg Hx     Medications- reviewed and updated Current Outpatient Medications  Medication Sig Dispense Refill  . Multiple Vitamin (MULTIVITAMIN) capsule Take 1 capsule by mouth daily.     No current facility-administered medications for this visit.    Allergies-reviewed and  updated Allergies  Allergen Reactions  . Sulfa Antibiotics Rash  . Codeine Nausea Only  . Septra [Sulfamethoxazole-Trimethoprim] Rash    Social History   Social History Narrative   Married. 2 grown children from prior marriage. 3 step children. No grandkids yet.    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 Gen: NAD, resting comfortably HEENT: Mucous membranes are moist. Oropharynx normal Neck: no thyromegaly CV: RRR no murmurs rubs or gallops Lungs: CTAB no crackles, wheeze, rhonchi Abdomen: soft/nontender/nondistended/normal bowel sounds. No rebound or guarding.  Ext: no edema Skin: warm, dry Neuro: grossly normal, moves all extremities, PERRLA***   Assessment and Plan   65 y.o. female presenting for annual physical.  Health Maintenance counseling: 1. Anticipatory guidance: Patient counseled regarding regular dental exams ***q6 months, eye exams ***,  avoiding smoking and second hand smoke*** , limiting alcohol to 1 beverage per day*** .   2. Risk factor reduction:  Advised patient of need for regular exercise and diet rich and fruits and vegetables to reduce risk of heart attack and stroke. Exercise- ***. Diet-***.  Wt Readings from Last 3 Encounters:  01/04/20 163 lb (73.9 kg)  11/29/19 169 lb 12.8 oz (77 kg)  11/02/19 180 lb (81.6 kg)   3. Immunizations/screenings/ancillary studies Immunization History  Administered Date(s) Administered  . Influenza,inj,Quad PF,6+ Mos 06/27/2014, 06/25/2017, 07/08/2018, 06/26/2019  . Influenza-Unspecified 06/23/2020  . Moderna Sars-Covid-2 Vaccination  11/18/2019  . Tdap 05/16/2013  . Zoster Recombinat (Shingrix) 06/22/2020, 09/04/2020   Health Maintenance Due  Topic Date Due  . COVID-19 Vaccine (2 - Moderna 3-dose series) 12/16/2019   4. Cervical cancer screening- *** 5. Breast cancer screening-  breast exam *** and mammogram *** 6. Colon cancer screening - *** 7. Skin cancer screening- ***advised regular sunscreen use. Denies worrisome, changing, or new skin lesions.  8. Birth control/STD check- *** 9. Osteoporosis screening at 43- *** -*** smoker  Status of chronic or acute concerns  #hyperlipidemia S: Medication:***  Lab Results  Component Value Date   CHOL 236 (H) 11/29/2019   HDL 89.60 11/29/2019   LDLCALC 125 (H) 11/29/2019   TRIG 106.0 11/29/2019   CHOLHDL 3 11/29/2019   A/P: ***  #Vitamin D deficiency S: Medication: *** Last vitamin D Lab Results  Component Value Date   VD25OH 32.73 11/29/2019   A/P: ***   breast cancer sister and paternal aunt- gets 3d mammograms *** *** No diagnosis found.  Recommended follow up: ***No follow-ups on file. Future Appointments  Date Time Provider [place]  01/22/2021  2:20 PM Marin Olp, MD [facility]    No chief complaint on file.  Lab/Order associations:*** fasting No diagnosis found.  No orders of the defined types were placed in this encounter.   Return precautions advised.  Clyde Lundborg, CMA

## 2021-01-22 ENCOUNTER — Encounter: Payer: BC Managed Care – PPO | Admitting: Family Medicine

## 2021-01-22 DIAGNOSIS — E785 Hyperlipidemia, unspecified: Secondary | ICD-10-CM

## 2021-01-22 DIAGNOSIS — E559 Vitamin D deficiency, unspecified: Secondary | ICD-10-CM

## 2021-01-22 DIAGNOSIS — Z0000 Encounter for general adult medical examination without abnormal findings: Secondary | ICD-10-CM

## 2021-01-22 NOTE — Patient Instructions (Addendum)
Please stop by lab before you go If you have mychart- we will send your results within 3 business days of [place] receiving them.  If you do not have mychart- we will call you about results within 5 business days of [place] receiving them.  *please also note that you will see labs on mychart as soon as they post. I will later go in and write notes on them- will say "notes from Dr. Yong Channel"  No changes today unless labs lead [place] to make changes  Recommended follow up: Return in about 1 year (around 01/23/2022) for physical or sooner if needed.

## 2021-01-22 NOTE — Progress Notes (Signed)
Phone [phone]   Subjective:  Patient presents today for their annual physical. Chief complaint-noted.   See problem oriented charting- ROS- full  review of systems was completed and negative except for: seasonal allergies- tree pollen  The following were reviewed and entered/updated in epic: Past Medical History:  Diagnosis Date  . Chest tightness    stress test in 2010s was reassuring after pain with mowing lawn. mild intermittent not exertiona since that time   Patient Active Problem List   Diagnosis Date Noted  . Hyperlipidemia, unspecified 11/29/2019    Priority: Medium  . Vitamin D deficiency 11/29/2019    Priority: Low  . Allergic rhinitis 05/21/2013    Priority: Low   Past Surgical History:  Procedure Laterality Date  . CHOLECYSTECTOMY    . COLONOSCOPY  04/22/2016   CIN1 after an ascus  . DILATION AND CURETTAGE OF UTERUS    . WISDOM TOOTH EXTRACTION      Family History  Problem Relation Age of Onset  . Breast cancer Sister        survived- lumpectomy and radiation  . Other Sister        ovarian growths were not cancerous  . Breast cancer Paternal Aunt   . Pancreatic cancer Paternal Aunt   . Osteoporosis Mother   . Glaucoma Mother   . Other Father        was MD. benign brain tumor- cholesteatoma posterior fossa. 3 recurrence and last noted on CT- surgery prior  . Leukemia Maternal Grandmother   . Hypertension Maternal Grandmother   . Other Maternal Grandfather        died at age 73  . Lung cancer Paternal Grandmother        nonsmoker- worked in Marketing executive of [employer]  . Parkinson's disease Paternal Grandfather   . Colon cancer Neg Hx   . Colon polyps Neg Hx   . Esophageal cancer Neg Hx   . Rectal cancer Neg Hx   . Stomach cancer Neg Hx     Medications- reviewed and updated Current Outpatient Medications  Medication Sig Dispense Refill  . Multiple Vitamin (MULTIVITAMIN) capsule Take 1 capsule by mouth daily.     No current  facility-administered medications for this visit.    Allergies-reviewed and updated Allergies  Allergen Reactions  . Sulfa Antibiotics Rash  . Codeine Nausea Only  . Septra [Sulfamethoxazole-Trimethoprim] Rash    Social History   Social History Narrative   Married. 2 grown children from prior marriage. 3 step children. No grandkids yet.    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 Oropharynx normal Neck: no thyromegaly CV: RRR no murmurs rubs or gallops Lungs: CTAB no crackles, wheeze, rhonchi Abdomen: soft/nontender/nondistended/normal bowel sounds. No rebound or guarding.  Ext: no edema Skin: warm, dry Neuro: grossly normal, moves all extremities, PERRLA   Assessment and  Plan   65 y.o. female presenting for annual physical.  Health Maintenance counseling: 1. Anticipatory guidance: Patient counseled regarding regular dental exams -q6 months, eye exams - yearly,  avoiding smoking and second hand smoke , limiting alcohol to 1 beverage per day-5 a week, no illicit drugs .   2. Risk factor reduction:  Advised patient of need for regular exercise and diet rich and fruits and vegetables to reduce risk of heart attack and stroke. Exercise- walking most days of the week once again. Diet-last year down 11 lbs and down another 3 lbs- actually up a few lbs from vacation and may lose this again- congratulated her on efforts. Watching carb intake.   Wt Readings from Last 3 Encounters:  01/23/21 166 lb 6.4 oz (75.5 kg)  01/04/20 163 lb (73.9 kg)  11/29/19 169 lb 12.8 oz (77 kg)  3. Immunizations/screenings/ancillary studies-  Has had 3 covid vaccines Immunization History  Administered Date(s) Administered  . Influenza,inj,Quad PF,6+ Mos 06/27/2014, 06/25/2017, 07/08/2018, 06/26/2019  . Influenza-Unspecified 06/23/2020  . Moderna SARS-COV2 Booster Vaccination 08/21/2020  . Moderna Sars-Covid-2 Vaccination 11/18/2019, 12/16/2019  . Tdap 05/16/2013  . Zoster Recombinat (Shingrix) 06/22/2020, 09/04/2020  4. Cervical cancer screening- 11/29/19 for pap smear with GYN 5. Breast cancer screening-  breast exam with GYN and mammogram 07/12/20 3d mammogram 6. Colon cancer screening - 11/30/2018 with 10 year repeat planned 7. Skin cancer screening- Dr. Jarome Matin as needed for derm. advised regular sunscreen use. Denies worrisome, changing, or new skin lesions.  8. Birth control/STD check- postmenopausal  And monogomous 9. Osteoporosis screening at 44- bone denisty 12/08/2018 with GYN- doing MV with calcium- could get some through diet. Osteopenia previously noted through GYN -Never smoker  Status of chronic or acute concerns   #hyperlipidemia S: Medication:none  Lab Results  Component Value Date   CHOL 236 (H) 11/29/2019   HDL 89.60 11/29/2019   LDLCALC 125 (H) 11/29/2019   TRIG 106.0 11/29/2019   CHOLHDL 3 11/29/2019   A/P: 10 year ascvd risk only 3.7%- update today and recalculate- likely remain off statin (also her preference). Would risk stratify further if above 7.5%- discussed coronary calcium scoring but doubt that will be needed  #Vitamin D deficiency S: Medication: MV with vitamin D in it Last vitamin D Lab Results  Component Value Date   VD25OH 32.73 11/29/2019  A/P: controlled last year- update today   Recommended follow up: Return in about 1 year (around 01/23/2022) for physical or sooner if needed.  Lab/Order  associations: fasting   ICD-10-CM   1. Preventative health care  Z00.00 CBC with Differential/Platelet    Comprehensive metabolic panel    Lipid panel    VITAMIN D 25 Hydroxy (Vit-D Deficiency, Fractures)  2. Vitamin D deficiency  E55.9 VITAMIN D 25 Hydroxy (Vit-D Deficiency, Fractures)  3. Hyperlipidemia, unspecified hyperlipidemia type  E78.5 CBC with Differential/Platelet    Comprehensive metabolic panel    Lipid panel    No orders of the defined types were placed in this encounter.   Return precautions advised.  Garret Reddish, MD

## 2021-01-23 ENCOUNTER — Ambulatory Visit (INDEPENDENT_AMBULATORY_CARE_PROVIDER_SITE_OTHER): Payer: BC Managed Care – PPO | Admitting: Family Medicine

## 2021-01-23 ENCOUNTER — Encounter: Payer: Self-pay | Admitting: Family Medicine

## 2021-01-23 ENCOUNTER — Other Ambulatory Visit: Payer: Self-pay

## 2021-01-23 VITALS — BP 124/88 | HR 82 | Temp 98.3°F | Ht 66.0 in | Wt 166.4 lb

## 2021-01-23 DIAGNOSIS — E785 Hyperlipidemia, unspecified: Secondary | ICD-10-CM

## 2021-01-23 DIAGNOSIS — Z0000 Encounter for general adult medical examination without abnormal findings: Secondary | ICD-10-CM

## 2021-01-23 DIAGNOSIS — E559 Vitamin D deficiency, unspecified: Secondary | ICD-10-CM | POA: Diagnosis not present

## 2021-01-23 LAB — CBC WITH DIFFERENTIAL/PLATELET
Basophils Absolute: 0 10*3/uL (ref 0.0–0.1)
Basophils Relative: 0.9 % (ref 0.0–3.0)
Eosinophils Absolute: 0.1 10*3/uL (ref 0.0–0.7)
Eosinophils Relative: 1.6 % (ref 0.0–5.0)
HCT: 39.1 % (ref 36.0–46.0)
Hemoglobin: 13.2 g/dL (ref 12.0–15.0)
Lymphocytes Relative: 35.8 % (ref 12.0–46.0)
Lymphs Abs: 1.9 10*3/uL (ref 0.7–4.0)
MCHC: 33.7 g/dL (ref 30.0–36.0)
MCV: 91.2 fl (ref 78.0–100.0)
Monocytes Absolute: 0.4 10*3/uL (ref 0.1–1.0)
Monocytes Relative: 8.4 % (ref 3.0–12.0)
Neutro Abs: 2.8 10*3/uL (ref 1.4–7.7)
Neutrophils Relative %: 53.3 % (ref 43.0–77.0)
Platelets: 280 10*3/uL (ref 150.0–400.0)
RBC: 4.29 Mil/uL (ref 3.87–5.11)
RDW: 13.1 % (ref 11.5–15.5)
WBC: 5.3 10*3/uL (ref 4.0–10.5)

## 2021-01-23 LAB — LIPID PANEL
Cholesterol: 230 mg/dL — ABNORMAL HIGH (ref 0–200)
HDL: 85.5 mg/dL (ref >39.00)
LDL Cholesterol: 128 mg/dL — ABNORMAL HIGH (ref 0–99)
NonHDL: 144.68
Total CHOL/HDL Ratio: 3
Triglycerides: 82 mg/dL (ref 0.0–149.0)
VLDL: 16.4 mg/dL (ref 0.0–40.0)

## 2021-01-23 LAB — COMPREHENSIVE METABOLIC PANEL
ALT: 16 U/L (ref 0–35)
AST: 19 U/L (ref 0–37)
Albumin: 4.1 g/dL (ref 3.5–5.2)
Alkaline Phosphatase: 63 U/L (ref 39–117)
BUN: 19 mg/dL (ref 6–23)
CO2: 29 mEq/L (ref 19–32)
Calcium: 9.3 mg/dL (ref 8.4–10.5)
Chloride: 103 mEq/L (ref 96–112)
Creatinine, Ser: 0.65 mg/dL (ref 0.40–1.20)
GFR: 93.9 mL/min (ref >60.00)
Glucose, Bld: 92 mg/dL (ref 70–99)
Potassium: 4.2 mEq/L (ref 3.5–5.1)
Sodium: 140 mEq/L (ref 135–145)
Total Bilirubin: 0.9 mg/dL (ref 0.2–1.2)
Total Protein: 6.8 g/dL (ref 6.0–8.3)

## 2021-01-23 LAB — VITAMIN D 25 HYDROXY (VIT D DEFICIENCY, FRACTURES): VITD: 35.94 ng/mL (ref 30.00–100.00)

## 2021-02-15 ENCOUNTER — Encounter: Payer: Self-pay | Admitting: Family Medicine

## 2021-04-05 ENCOUNTER — Encounter: Payer: Self-pay | Admitting: Family Medicine

## 2021-04-08 ENCOUNTER — Other Ambulatory Visit: Payer: Self-pay

## 2021-04-08 DIAGNOSIS — R002 Palpitations: Secondary | ICD-10-CM

## 2021-04-08 DIAGNOSIS — I493 Ventricular premature depolarization: Secondary | ICD-10-CM

## 2021-04-22 NOTE — Telephone Encounter (Signed)
Pt is schedule with Cardiology 07/10/2021.

## 2021-07-10 ENCOUNTER — Ambulatory Visit: Payer: BC Managed Care – PPO | Admitting: Interventional Cardiology

## 2021-07-29 ENCOUNTER — Other Ambulatory Visit: Payer: Self-pay | Admitting: Family Medicine

## 2021-07-29 DIAGNOSIS — Z1231 Encounter for screening mammogram for malignant neoplasm of breast: Secondary | ICD-10-CM

## 2021-08-08 DIAGNOSIS — N870 Mild cervical dysplasia: Secondary | ICD-10-CM | POA: Diagnosis not present

## 2021-08-08 DIAGNOSIS — Z6827 Body mass index (BMI) 27.0-27.9, adult: Secondary | ICD-10-CM | POA: Diagnosis not present

## 2021-08-08 DIAGNOSIS — Z01419 Encounter for gynecological examination (general) (routine) without abnormal findings: Secondary | ICD-10-CM | POA: Diagnosis not present

## 2021-08-20 DIAGNOSIS — R109 Unspecified abdominal pain: Secondary | ICD-10-CM | POA: Diagnosis not present

## 2021-08-30 ENCOUNTER — Other Ambulatory Visit: Payer: Self-pay

## 2021-08-30 ENCOUNTER — Ambulatory Visit
Admission: RE | Admit: 2021-08-30 | Discharge: 2021-08-30 | Disposition: A | Discharge status: Home / Self Care | Payer: BC Managed Care – PPO | Source: Ambulatory Visit | Attending: Family Medicine | Admitting: Family Medicine

## 2021-08-30 DIAGNOSIS — Z1231 Encounter for screening mammogram for malignant neoplasm of breast: Secondary | ICD-10-CM

## 2021-09-30 ENCOUNTER — Ambulatory Visit: Payer: BC Managed Care – PPO | Admitting: Interventional Cardiology

## 2021-11-06 ENCOUNTER — Ambulatory Visit: Payer: BC Managed Care – PPO | Admitting: Interventional Cardiology

## 2021-12-02 ENCOUNTER — Other Ambulatory Visit: Payer: Self-pay

## 2021-12-02 ENCOUNTER — Encounter: Payer: Self-pay | Admitting: Family Medicine

## 2021-12-02 DIAGNOSIS — I493 Ventricular premature depolarization: Secondary | ICD-10-CM

## 2021-12-24 ENCOUNTER — Encounter: Payer: Self-pay | Admitting: Family Medicine

## 2021-12-25 ENCOUNTER — Ambulatory Visit (INDEPENDENT_AMBULATORY_CARE_PROVIDER_SITE_OTHER): Payer: BC Managed Care – PPO

## 2021-12-25 ENCOUNTER — Encounter: Payer: Self-pay | Admitting: Cardiology

## 2021-12-25 ENCOUNTER — Ambulatory Visit: Payer: BC Managed Care – PPO | Admitting: Cardiology

## 2021-12-25 VITALS — BP 118/76 | HR 83 | Ht 66.0 in | Wt 167.0 lb

## 2021-12-25 DIAGNOSIS — I493 Ventricular premature depolarization: Secondary | ICD-10-CM

## 2021-12-25 DIAGNOSIS — R072 Precordial pain: Secondary | ICD-10-CM | POA: Diagnosis not present

## 2021-12-25 LAB — BASIC METABOLIC PANEL
BUN/Creatinine Ratio: 30 — ABNORMAL HIGH (ref 12–28)
BUN: 22 mg/dL (ref 8–27)
CO2: 27 mmol/L (ref 20–29)
Calcium: 9.9 mg/dL (ref 8.7–10.3)
Chloride: 104 mmol/L (ref 96–106)
Creatinine, Ser: 0.74 mg/dL (ref 0.57–1.00)
Glucose: 102 mg/dL — ABNORMAL HIGH (ref 70–99)
Potassium: 4.4 mmol/L (ref 3.5–5.2)
Sodium: 141 mmol/L (ref 134–144)
eGFR: 90 mL/min/{1.73_m2} (ref >59)

## 2021-12-25 LAB — TSH: TSH: 1.13 u[IU]/mL (ref 0.450–4.500)

## 2021-12-25 LAB — MAGNESIUM: Magnesium: 2.2 mg/dL (ref 1.6–2.3)

## 2021-12-25 MED ORDER — METOPROLOL TARTRATE 100 MG PO TABS: 100.0000 mg | ORAL_TABLET | Freq: Once | ORAL | 0 refills | Status: DC

## 2021-12-25 NOTE — Progress Notes (Signed)
? ?Cardiology CONSULT Note   ? ?Date:  12/25/2021  ? ?ID:  Doris Brown, DOB 04-Sep-1956, MRN [id number] ? ?PCP:  Marin Olp, MD  ?Cardiologist:  Fransico Him, MD  ? ?Chief Complaint  ?Patient presents with  ? New Patient (Initial Visit)  ?  PVCs  ? ? ?History of Present Illness:  ?Doris Brown is a 66 y.o. female who is being seen today for the evaluation of PVCs at the request of Marin Olp, MD. ? ?This is a 66 year old female with no significant past medical history except for some chest pain back in 2010 with normal stress test at that time.  She tells me last summer she started noticing palpitations when sitting out on the porch or in bed.  It would occur intermittently.  She went to [place] last fall and starting drinking tonic water but came home and stopped drinking tonic water and they stopped.  She then had another tonic H2O and the PVCs came back and she totally stopped the tonic H2O but now her palpitations are back.  She normally feels them when she is quiet or at night.  They can last an hour at a time every few minutes.  She has a Paramedic and noted PVCs.  She has no family hx of cardiac disease and has never smoked.  She occasionally drinks ETOH but has not noticed a correlation  She drinks 2 cups of coffee every morning.  She occasionally has some tightness in her chest which is a feeling of anxiety.  It occurs when she gets the palpations but also gets them sometimes when she exerts herself.  There is no associated SOB, nausea or diaphoresis and no radiation of the discomfort.  She walks 30-40 min daily.   ? ?Past Medical History:  ?Diagnosis Date  ? Chest tightness   ? stress test in 2010s was reassuring after pain with mowing lawn. mild intermittent not exertiona since that time  ? ? ?Past Surgical History:  ?Procedure Laterality Date  ? CHOLECYSTECTOMY    ? COLONOSCOPY  04/22/2016  ? CIN1 after an ascus  ? DILATION AND CURETTAGE OF UTERUS    ? WISDOM TOOTH EXTRACTION     ? ? ?Current Medications: ?Current Meds  ?Medication Sig  ? metoprolol tartrate (LOPRESSOR) 100 MG tablet Take 1 tablet (100 mg total) by mouth once for 1 dose. Take 1 tablet (100 mg total) two hours prior to CT scan.  ? Multiple Vitamin (MULTIVITAMIN) capsule Take 1 capsule by mouth daily.  ? ? ?Allergies:   Sulfa antibiotics, Codeine, and Septra [sulfamethoxazole-trimethoprim]  ? ?Social History  ? ?Socioeconomic History  ? Marital status: Married  ?  Spouse name: Not on file  ? Number of children: Not on file  ? Years of education: Not on file  ? Highest education level: Not on file  ?Occupational History  ? Not on file  ?Tobacco Use  ? Smoking status: Never  ? Smokeless tobacco: Never  ?Vaping Use  ? Vaping Use: Never used  ?Substance and Sexual Activity  ? Alcohol use: Yes  ?  Alcohol/week: 7.0 standard drinks  ?  Types: 7 Glasses of wine per week  ? Drug use: No  ? Sexual activity: Yes  ?  Partners: Male  ?  Comment: husband only  ?Other Topics Concern  ? Not on file  ?Social History Narrative  ? Married. 2 grown children from prior marriage. 3 step children. No grandkids yet.   ?  [employer] retriever  ?   ? 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   ? 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 - [place] and nursing at [place]  ?   ? Hobbies: place [place] (husband big fisherman), reading, cooking   ? ?Social Determinants of Health  ? ?Financial Resource Strain: Not on file  ?Food Insecurity: Not on file  ?Transportation Needs: Not on file  ?Physical Activity: Not on file  ?Stress: Not on file  ?Social Connections: Not on file  ?  ? ?Family History:  The patient's family history includes Breast cancer in her paternal aunt and sister; Glaucoma in her mother; Hypertension in her maternal grandmother; Leukemia in her maternal grandmother; Lung cancer in her paternal  grandmother; Osteoporosis in her mother; Other in her father, maternal grandfather, and sister; Pancreatic cancer in her paternal aunt; Parkinson's disease in her paternal grandfather.  ? ?ROS:   ?Please see the history of present illness.    ?ROS All other systems reviewed and are negative. ? ?   ? View : No data to display.  ?  ?  ?  ? ? ? ? ? ?PHYSICAL EXAM:   ?VS:  BP 118/76   Pulse 83   Ht '5\' 6"'$  (1.676 m)   Wt 167 lb (75.8 kg)   SpO2 100%   BMI 26.95 kg/m?    ?GEN: Well nourished, well developed, in no acute distress  ?HEENT: normal  ?Neck: no JVD, carotid bruits, or masses ?Cardiac: RRR; no murmurs, rubs, or gallops,no edema.  Intact distal pulses bilaterally.  ?Respiratory:  clear to auscultation bilaterally, normal work of breathing ?GI: soft, nontender, nondistended, + BS ?MS: no deformity or atrophy  ?Skin: warm and dry, no rash ?Neuro:  Alert and Oriented x 3, Strength and sensation are intact ?Psych: euthymic mood, full affect ? ?Wt Readings from Last 3 Encounters:  ?12/25/21 167 lb (75.8 kg)  ?01/23/21 166 lb 6.4 oz (75.5 kg)  ?01/04/20 163 lb (73.9 kg)  ?  ? ? ?Studies/Labs Reviewed:  ? ?EKG:  EKG is ordered today.  The ekg ordered today demonstrates NSR with nonspecific T wave abnormality ? ?Recent Labs: ?01/23/2021: ALT 16; BUN 19; Creatinine, Ser 0.65; Hemoglobin 13.2; Platelets 280.0; Potassium 4.2; Sodium 140  ? ?Lipid Panel ?   ?Component Value Date/Time  ? CHOL 230 (H) 01/23/2021 1419  ? TRIG 82.0 01/23/2021 1419  ? HDL 85.50 01/23/2021 1419  ? CHOLHDL 3 01/23/2021 1419  ? VLDL 16.4 01/23/2021 1419  ? [place] 128 (H) 01/23/2021 1419  ? [place] 138 (H) 12/08/2017 1114  ? ? ?Additional studies/ records that were reviewed today include:  ?none ? ? ? ?ASSESSMENT:   ? ?1. PVC (premature ventricular contraction)   ?2. Precordial pain   ? ? ? ?PLAN:  ?In order of problems listed above: ? ?PVCs ?-She has been having a sporadically but recently in the past 8 months they seem to have increased in  frequency. ?-She does drink 2 cups of coffee a day but really no correlation to when she has drank caffeine.  She also occasionally drinks alcohol but again there is no correlation to when she gets the PVCs. ?-She has a cardia mobile device which showed PVCs ?-I will get a bmet, magnesium and TSH ?-Check 2D echo to rule out structural heart disease ?-2-week Zio patch to assess PVC load ? ?2.  Chest pain ?-This is somewhat atypical ?-It occurs mainly when she has her palpitations but occasionally will occur at other times as well and is described as a tightness ?-She really has no cardiac risk factors except postmenopausal state ?-I will get a coronary CTA to define coronary anatomy ? ?Time Spent: ?20 minutes total time of encounter, including 15 minutes spent in face-to-face patient care on the date of this encounter. This time includes coordination of care and counseling regarding above mentioned problem list. Remainder of non-face-to-face time involved reviewing chart documents/testing relevant to the patient encounter and documentation in the medical record. I have independently reviewed documentation from referring provider ? ?Medication Adjustments/Labs and Tests Ordered: ?Current medicines are reviewed at length with the patient today.  Concerns regarding medicines are outlined above.  Medication changes, Labs and Tests ordered today are listed in the Patient Instructions below. ? ?Patient Instructions  ?Medication Instructions:  ?Your physician recommends that you continue on your current medications as directed. Please refer to the Current Medication list given to you today. ? ?*If you need a refill on your cardiac medications before your next appointment, please call your pharmacy* ? ?Lab Work: ?TODAY: BMET, Mg, TSH ?If you have labs (blood work) drawn today and your tests are completely normal, you will receive your results only by: ?MyChart Message (if you have MyChart) OR ?A paper copy in the mail ?If  you have any lab test that is abnormal or we need to change your treatment, we will call you to review the results. ? ?Testing/Procedures: ?Your physician has requested that you have an echocardiogram. Echocardiography

## 2021-12-25 NOTE — Progress Notes (Unsigned)
Applied a 14 day Zio XT monitor to patient in the office ?

## 2021-12-25 NOTE — Patient Instructions (Addendum)
Medication Instructions:  ?Your physician recommends that you continue on your current medications as directed. Please refer to the Current Medication list given to you today. ? ?*If you need a refill on your cardiac medications before your next appointment, please call your pharmacy* ? ?Lab Work: ?TODAY: BMET, Mg, TSH ?If you have labs (blood work) drawn today and your tests are completely normal, you will receive your results only by: ?MyChart Message (if you have MyChart) OR ?A paper copy in the mail ?If you have any lab test that is abnormal or we need to change your treatment, we will call you to review the results. ? ?Testing/Procedures: ?Your physician has requested that you have an echocardiogram. Echocardiography is a painless test that uses sound waves to create images of your heart. It provides your doctor with information about the size and shape of your heart and how well your heart?s chambers and valves are working. This procedure takes approximately one hour. There are no restrictions for this procedure. ? ?Your physician has recommended that you wear an event monitor. Event monitors are medical devices that record the heart?s electrical activity. Doctors most often [place] these monitors to diagnose arrhythmias. Arrhythmias are problems with the speed or rhythm of the heartbeat. The monitor is a small, portable device. You can wear one while you do your normal daily activities. This is usually used to diagnose what is causing palpitations/syncope (passing out). ? ?Your physician has recommended that you have a coronary CTA scan. Please see next page for further instructions.  ? ? ?Follow-Up: ?At [facility], you and your health needs are our priority.  As part of our continuing mission to provide you with exceptional heart care, we have created designated Provider Care Teams.  These Care Teams include your primary Cardiologist (physician) and Advanced Practice Providers (APPs -  Physician Assistants  and Nurse Practitioners) who all work together to provide you with the care you need, when you need it. ? ?Follow up with Dr. Radford Pax as needed based on results of testing ? ? ?Other Instructions ? ? ?Your cardiac CT will be scheduled at:  ? ?[facility] ?[address] ?[place], [place] [address] ?[phone] ? ?Please arrive at the [facility] Entrance (Entrance C2) of [facility] 30 minutes prior to test start time. ?You can use the FREE valet parking offered at entrance C (encouraged to control the heart rate for the test)  ?Proceed to the [facility] Radiology Department (first floor) to check-in and test prep. ? ?All radiology patients and guests should use entrance C2 at [facility], accessed from [facility], even though the hospital's physical address listed is [address] ? ? ? ? ?Please follow these instructions carefully (unless otherwise directed): ? ?On the Night Before the Test: ?Be sure to Drink plenty of water. ?Do not consume any caffeinated/decaffeinated beverages or chocolate 12 hours prior to your test. ?Do not take any antihistamines 12 hours prior to your test. ? ? ?On the Day of the Test: ?Drink plenty of water until 1 hour prior to the test. ?Do not eat any food 4 hours prior to the test. ?You may take your regular medications prior to the test.  ?Take metoprolol (Lopressor) two hours prior to test. ?HOLD Furosemide/Hydrochlorothiazide morning of the test. ?FEMALES- please wear underwire-free bra if available, avoid dresses & tight clothing ? ?After the Test: ?Drink plenty of water. ?After receiving IV contrast, you may experience a mild  flushed feeling. This is normal. ?On occasion, you may experience a mild rash up to 24 hours after the test. This is not dangerous. If this occurs, you can take Benadryl 25 mg and increase your fluid intake. ?If you experience trouble breathing, this can be serious. If it is severe call 911  IMMEDIATELY. If it is mild, please call our office. ?If you take any of these medications: Glipizide/Metformin, Avandament, Glucavance, please do not take 48 hours after completing test unless otherwise instructed. ? ?We will call to schedule your test 2-4 weeks out understanding that some insurance companies will need an authorization prior to the service being performed.  ? ?For non-scheduling related questions, please contact the cardiac imaging nurse navigator should you have any questions/concerns: ?Marchia Bond, Cardiac Imaging Nurse Navigator ?Gordy Clement, Cardiac Imaging Nurse Navigator ?[facility] ?Direct Office Dial: [phone]  ? ?For scheduling needs, including cancellations and rescheduling, please call [place], [phone].   ? ? ?ZIO XT- Long Term Monitor Instructions ? ?Your physician has requested you wear a ZIO patch monitor for 14 days.  ?This is a single patch monitor. [employer] supplies one patch monitor per enrollment. Additional ?stickers are not available. Please do not apply patch if you will be having a Nuclear Stress Test,  ?Echocardiogram, Cardiac CT, MRI, or Chest Xray during the period you would be wearing the  ?monitor. The patch cannot be worn during these tests. You cannot remove and re-apply the  ?ZIO XT patch monitor.  ?Your ZIO patch monitor will be mailed 3 day USPS to your address on file. It may take 3-5 days  ?to receive your monitor after you have been enrolled.  ?Once you have received your monitor, please review the enclosed instructions. Your monitor  ?has already been registered assigning a specific monitor serial # to you. ? ?Billing and Patient Assistance Program Information ? ?We have supplied [employer] with any of your insurance information on file for billing purposes. ?[employer] offers a sliding scale Patient Assistance Program for patients that do not have  ?insurance, or whose insurance does not completely cover the cost of the ZIO  monitor.  ?You must apply for the Patient Assistance Program to qualify for this discounted rate.  ?To apply, please call [employer] at [phone], select option 4, select option 2, ask to apply for  ?Patient Assistance Program. Theodore Demark will ask your household income, and how many people  ?are in your household. They will quote your out-of-pocket cost based on that information.  ?[employer] will also be able to set up a 64-month interest-free payment plan if needed. ? ?Applying the monitor ?  ?Shave hair from upper left chest.  ?Hold abrader disc by orange tab. Rub abrader in 40 strokes over the upper left chest as  ?indicated in your monitor instructions.  ?Clean area with 4 enclosed alcohol pads. Let dry.  ?Apply patch as indicated in monitor instructions. Patch will be placed under collarbone on left  ?side of chest with arrow pointing upward.  ?Rub patch adhesive wings for 2 minutes. Remove white label marked "1". Remove the white  ?label marked "2". Rub patch adhesive wings for 2 additional minutes.  ?While looking in a mirror, press and release button in center of patch. A small green light will  ?flash 3-4 times. This will be your only indicator that the monitor has been turned on.  ?Do not shower for the first 24 hours. You may shower after the first 24 hours.  ?Press  the button if you feel a symptom. You will hear a small click. Record Date, Time and  ?Symptom in the Patient Logbook.  ?When you are ready to remove the patch, follow instructions on the last 2 pages of Patient  ?Logbook. Stick patch monitor onto the last page of Patient Logbook.  ?Place Patient Logbook in the blue and white box. Use locking tab on box and tape box closed  ?securely. The blue and white box has prepaid postage on it. Please place it in the mailbox as  ?soon as possible. Your physician should have your test results approximately 7 days after the  ?monitor has been mailed back to [facility].  ?Call [facility] at  [phone] if you have questions regarding  ?your ZIO XT patch monitor. Call them immediately if you see an orange light blinking on your  ?monitor.  ?If your monitor falls off in less than 4 days, contac

## 2022-01-14 DIAGNOSIS — I493 Ventricular premature depolarization: Secondary | ICD-10-CM | POA: Diagnosis not present

## 2022-01-14 DIAGNOSIS — R072 Precordial pain: Secondary | ICD-10-CM | POA: Diagnosis not present

## 2022-01-22 ENCOUNTER — Ambulatory Visit (HOSPITAL_BASED_OUTPATIENT_CLINIC_OR_DEPARTMENT_OTHER): Payer: BC Managed Care – PPO

## 2022-01-22 ENCOUNTER — Telehealth (HOSPITAL_COMMUNITY): Payer: Self-pay | Admitting: *Deleted

## 2022-01-22 DIAGNOSIS — R072 Precordial pain: Secondary | ICD-10-CM

## 2022-01-22 DIAGNOSIS — I493 Ventricular premature depolarization: Secondary | ICD-10-CM | POA: Diagnosis not present

## 2022-01-22 LAB — ECHOCARDIOGRAM COMPLETE
Area-P 1/2: 3.19 cm2
S' Lateral: 2.4 cm

## 2022-01-22 NOTE — Telephone Encounter (Signed)
Reaching out to patient to offer assistance regarding upcoming cardiac imaging study; pt verbalizes understanding of appt date/time, parking situation and where to check in, pre-test NPO status and medications ordered, and verified current allergies; name and call back number provided for further questions should they arise  Dayle Sherpa RN Navigator Cardiac Imaging [facility] [phone] office [phone] cell  Patient to take 100mg metoprolol tartrate two hours prior to her cardiac CT scan. She is aware to arrive at 9am. 

## 2022-01-23 ENCOUNTER — Encounter (HOSPITAL_COMMUNITY): Payer: Self-pay

## 2022-01-23 ENCOUNTER — Ambulatory Visit (HOSPITAL_COMMUNITY)
Admission: RE | Admit: 2022-01-23 | Discharge: 2022-01-23 | Disposition: A | Discharge status: Home / Self Care | Payer: BC Managed Care – PPO | Source: Ambulatory Visit | Attending: Cardiology | Admitting: Cardiology

## 2022-01-23 DIAGNOSIS — I493 Ventricular premature depolarization: Secondary | ICD-10-CM | POA: Diagnosis not present

## 2022-01-23 DIAGNOSIS — R072 Precordial pain: Secondary | ICD-10-CM | POA: Insufficient documentation

## 2022-01-23 MED ORDER — NITROGLYCERIN 0.4 MG SL SUBL
SUBLINGUAL_TABLET | SUBLINGUAL | Status: AC
  Filled 2022-01-23: qty 2

## 2022-01-23 MED ORDER — IOHEXOL 350 MG/ML SOLN
100.0000 mL | Freq: Once | INTRAVENOUS | Status: AC | PRN
  Administered 2022-01-23: 100 mL via INTRAVENOUS

## 2022-01-23 MED ORDER — NITROGLYCERIN 0.4 MG SL SUBL
0.8000 mg | SUBLINGUAL_TABLET | Freq: Once | SUBLINGUAL | Status: AC
  Administered 2022-01-23: 0.8 mg via SUBLINGUAL

## 2022-06-16 ENCOUNTER — Encounter: Payer: Self-pay | Admitting: *Deleted

## 2022-07-18 ENCOUNTER — Ambulatory Visit (INDEPENDENT_AMBULATORY_CARE_PROVIDER_SITE_OTHER): Payer: BC Managed Care – PPO | Admitting: Family Medicine

## 2022-07-18 ENCOUNTER — Encounter: Payer: Self-pay | Admitting: Family Medicine

## 2022-07-18 VITALS — BP 98/70 | HR 85 | Temp 98.1°F | Ht 66.0 in | Wt 171.8 lb

## 2022-07-18 DIAGNOSIS — Z0000 Encounter for general adult medical examination without abnormal findings: Secondary | ICD-10-CM | POA: Diagnosis not present

## 2022-07-18 DIAGNOSIS — E785 Hyperlipidemia, unspecified: Secondary | ICD-10-CM | POA: Diagnosis not present

## 2022-07-18 DIAGNOSIS — E559 Vitamin D deficiency, unspecified: Secondary | ICD-10-CM

## 2022-07-18 NOTE — Progress Notes (Signed)
Phone [phone]   Subjective:  Patient presents today for their annual physical. Chief complaint-noted.   See problem oriented charting- ROS- full  review of systems was completed and negative Per full ROS sheet completed by patient  The following were reviewed and entered/updated in epic: Past Medical History:  Diagnosis Date   Chest tightness    stress test in 2010s was reassuring after pain with mowing lawn. mild intermittent not exertiona since that time   Patient Active Problem List   Diagnosis Date Noted   Hyperlipidemia, unspecified 11/29/2019    Priority: Medium    Vitamin D deficiency 11/29/2019    Priority: Low   Allergic rhinitis 05/21/2013    Priority: Low   Past Surgical History:  Procedure Laterality Date   CHOLECYSTECTOMY     COLONOSCOPY  04/22/2016   CIN1 after an ascus   DILATION AND CURETTAGE OF UTERUS     WISDOM TOOTH EXTRACTION      Family History  Problem Relation Age of Onset   Breast cancer Sister        survived- lumpectomy and radiation   Other Sister        ovarian growths were not cancerous   Breast cancer Paternal Aunt    Pancreatic cancer Paternal Aunt    Osteoporosis Mother    Glaucoma Mother    Other Father        was MD. benign brain tumor- cholesteatoma posterior fossa. 3 recurrence and last noted on CT- surgery prior   Leukemia Maternal Grandmother    Hypertension Maternal Grandmother    Other Maternal Grandfather        died at age 35   Lung cancer Paternal Grandmother        nonsmoker- worked in Marketing executive of sewing industry   Parkinson's disease Paternal Grandfather    Colon cancer Neg Hx    Colon polyps Neg Hx    Esophageal cancer Neg Hx    Rectal cancer Neg Hx    Stomach cancer Neg Hx     Medications- reviewed and updated Current Outpatient Medications  Medication Sig Dispense Refill   Multiple Vitamin (MULTIVITAMIN) capsule Take 1 capsule by mouth daily.     metoprolol tartrate (LOPRESSOR) 100 MG tablet Take 1  tablet (100 mg total) by mouth once for 1 dose. Take 1 tablet (100 mg total) two hours prior to CT scan. 1 tablet 0   No current facility-administered medications for this visit.    Allergies-reviewed and updated Allergies  Allergen Reactions   Sulfa Antibiotics Rash   Codeine Nausea Only   Septra [Sulfamethoxazole-Trimethoprim] Rash    Social History   Social History Narrative   Married. 2 grown children from prior marriage. 3 step children. 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: place on [employer] (husband big fisherman), reading, cooking    Objective  Objective:  BP 98/70   Pulse 85   Temp 98.1 F (36.7 C)   Ht '5\' 6"'$  (1.676 m)   Wt 171 lb 12.8 oz (77.9 kg)   SpO2 98%  BMI 27.73 kg/m  Gen: NAD, resting comfortably HEENT: Mucous membranes are moist. Oropharynx normal. TM normal. Septum deviation noted Neck: no thyromegaly CV: RRR no murmurs rubs or gallops Lungs: CTAB no crackles, wheeze, rhonchi Abdomen: soft/nontender/nondistended/normal bowel sounds. No rebound or guarding.  Ext: no edema Skin: warm, dry Neuro: grossly normal, moves all extremities, PERRLA   Assessment and Plan   66 y.o. female presenting for annual physical.  Health Maintenance counseling: 1. Anticipatory guidance: Patient counseled regarding regular dental exams -q6 months or at least yearly, eye exams -  yearly,  avoiding smoking and second hand smoke , limiting alcohol to 1 beverage per day- 5-7 a week- glass of wine , no illicit drugs .   2. Risk factor reduction:  Advised patient of need for  regular exercise and diet rich and fruits and vegetables to reduce risk of heart attack and stroke.  Exercise- walking 40 minutes daily - 2 mile loop.  Diet/weight management-In last few months has loosened diet- plans to improve on this and trend back down on weight- down 8 from peak weight already.  Wt Readings from Last 3 Encounters:  07/18/22 171 lb 12.8 oz (77.9 kg)  12/25/21 167 lb (75.8 kg)  01/23/21 166 lb 6.4 oz (75.5 kg)  3. Immunizations/screenings/ancillary studies- declines covid but already had flu, declines rsv- plans on at [employer] History  Administered Date(s) Administered   Influenza,inj,Quad PF,6+ Mos 06/27/2014, 06/25/2017, 07/08/2018, 06/26/2019, 06/06/2022   Influenza-Unspecified 06/23/2020   Moderna SARS-COV2 Booster Vaccination 08/21/2020   Moderna Sars-Covid-2 Vaccination 11/18/2019, 12/16/2019   Tdap 05/16/2013   Zoster Recombinat (Shingrix) 06/22/2020, 09/04/2020  4. Cervical cancer screening- 11/29/19 for pap smear with [facility]- hpv negative so good for 5 years we believe- but will get copy of more recent exam 5. Breast cancer screening-  breast exam with GYN and mammogram 08/30/21 3d mammogram- sister with breast cancer 6. Colon cancer screening - 11/30/2018 with 10 year repeat planned 7. Skin cancer screening- Dr. Jarome Matin as needed for derm. advised regular sunscreen use. Denies worrisome, changing, or new skin lesions.  8. Birth control/STD check- postmenopausal  And monogomous 9. Osteoporosis screening at 88- bone denisty 12/08/2018 with GYN- doing MV with calcium- could get some through diet. Osteopenia previously noted through GYN- will look out for next few cycles -Never smoker  Status of chronic or acute concerns   #PVC workup- wore cardiac monitor and had some PVCs- better lately and didn't make significant changes. Mild changes on echo such as trial MV leakiness and mildly calcified AV  #hyperlipidemia- Ct calcium scoring 0 on  coronary morphology 01/23/22 S: Medication:none  The 10-year ASCVD risk score (Arnett DK, et al., 2019) is: 2.6% Lab Results  Component Value Date   CHOL 230 (H) 01/23/2021   HDL 85.50 01/23/2021   LDLCALC 128 (H) 01/23/2021   TRIG 82.0 01/23/2021   CHOLHDL 3 01/23/2021   A/P: update lipids  but with ct calcium scoring hold off  #Vitamin D deficiency S: Medication: MV with D Last vitamin D Lab Results  Component Value Date   VD25OH 35.94 01/23/2021  A/P: hopefully stable- update vitamin D today. Continue current meds for now   Recommended follow up: Return in about 1 year (around 07/19/2023) for physical or sooner if needed.Schedule b4 you leave.  Lab/Order associations: fasting   ICD-10-CM   1. Preventative health care  Z00.00     2. Vitamin D deficiency  E55.9 VITAMIN D 25 Hydroxy (Vit-D Deficiency, Fractures)  3. Hyperlipidemia, unspecified hyperlipidemia type  E78.5 Comprehensive metabolic panel    CBC with Differential/Platelet    Lipid panel     No orders of the defined types were placed in this encounter.  Return precautions advised.  Garret Reddish, MD

## 2022-07-18 NOTE — Addendum Note (Signed)
Addended by: Clyde Lundborg A on: 07/18/2022 03:18 PM   Modules accepted: Orders

## 2022-07-18 NOTE — Patient Instructions (Addendum)
Please stop by lab before you go If you have mychart- we will send your results within 3 business days of [place] receiving them.  If you do not have mychart- we will call you about results within 5 business days of [place] receiving them.  *please also note that you will see labs on mychart as soon as they post. I will later go in and write notes on them- will say "notes from Dr. Yong Channel"   Consider RSV at pharmacy  Sign release of information at the check out desk for last pap smear  Recommended follow up: Return in about 1 year (around 07/19/2023) for physical or sooner if needed.Schedule b4 you leave. -if on traditional medicare- we could do first visit as welcome to medicare

## 2022-07-19 LAB — COMPREHENSIVE METABOLIC PANEL
AG Ratio: 1.6 (calc) (ref 1.0–2.5)
ALT: 15 U/L (ref 6–29)
AST: 21 U/L (ref 10–35)
Albumin: 4.4 g/dL (ref 3.6–5.1)
Alkaline phosphatase (APISO): 62 U/L (ref 37–153)
BUN: 19 mg/dL (ref 7–25)
CO2: 25 mmol/L (ref 20–32)
Calcium: 9.7 mg/dL (ref 8.6–10.4)
Chloride: 104 mmol/L (ref 98–110)
Creat: 0.76 mg/dL (ref 0.50–1.05)
Globulin: 2.7 g/dL (calc) (ref 1.9–3.7)
Glucose, Bld: 92 mg/dL (ref 65–99)
Potassium: 4 mmol/L (ref 3.5–5.3)
Sodium: 140 mmol/L (ref 135–146)
Total Bilirubin: 0.7 mg/dL (ref 0.2–1.2)
Total Protein: 7.1 g/dL (ref 6.1–8.1)

## 2022-07-19 LAB — CBC WITH DIFFERENTIAL/PLATELET
Absolute Monocytes: 422 cells/uL (ref 200–950)
Basophils Absolute: 63 cells/uL (ref 0–200)
Basophils Relative: 1.1 %
Eosinophils Absolute: 29 cells/uL (ref 15–500)
Eosinophils Relative: 0.5 %
HCT: 42.7 % (ref 35.0–45.0)
Hemoglobin: 13.9 g/dL (ref 11.7–15.5)
Lymphs Abs: 2058 cells/uL (ref 850–3900)
MCH: 30.5 pg (ref 27.0–33.0)
MCHC: 32.6 g/dL (ref 32.0–36.0)
MCV: 93.6 fL (ref 80.0–100.0)
MPV: 10.3 fL (ref 7.5–12.5)
Monocytes Relative: 7.4 %
Neutro Abs: 3129 cells/uL (ref 1500–7800)
Neutrophils Relative %: 54.9 %
Platelets: 301 10*3/uL (ref 140–400)
RBC: 4.56 10*6/uL (ref 3.80–5.10)
RDW: 12.5 % (ref 11.0–15.0)
Total Lymphocyte: 36.1 %
WBC: 5.7 10*3/uL (ref 3.8–10.8)

## 2022-07-19 LAB — VITAMIN D 25 HYDROXY (VIT D DEFICIENCY, FRACTURES): Vit D, 25-Hydroxy: 36 ng/mL (ref 30–100)

## 2022-07-19 LAB — LIPID PANEL
Cholesterol: 241 mg/dL — ABNORMAL HIGH (ref <200)
HDL: 88 mg/dL (ref >=50)
LDL Cholesterol (Calc): 135 mg/dL (calc) — ABNORMAL HIGH
Non-HDL Cholesterol (Calc): 153 mg/dL (calc) — ABNORMAL HIGH (ref <130)
Total CHOL/HDL Ratio: 2.7 (calc) (ref <5.0)
Triglycerides: 81 mg/dL (ref <150)

## 2022-09-30 ENCOUNTER — Other Ambulatory Visit: Payer: Self-pay | Admitting: Family Medicine

## 2022-09-30 DIAGNOSIS — Z1231 Encounter for screening mammogram for malignant neoplasm of breast: Secondary | ICD-10-CM

## 2022-10-02 DIAGNOSIS — Z124 Encounter for screening for malignant neoplasm of cervix: Secondary | ICD-10-CM | POA: Diagnosis not present

## 2022-10-02 DIAGNOSIS — Z6827 Body mass index (BMI) 27.0-27.9, adult: Secondary | ICD-10-CM | POA: Diagnosis not present

## 2022-10-02 DIAGNOSIS — Z01419 Encounter for gynecological examination (general) (routine) without abnormal findings: Secondary | ICD-10-CM | POA: Diagnosis not present

## 2022-10-02 DIAGNOSIS — Z01411 Encounter for gynecological examination (general) (routine) with abnormal findings: Secondary | ICD-10-CM | POA: Diagnosis not present

## 2022-10-03 ENCOUNTER — Other Ambulatory Visit: Payer: Self-pay | Admitting: Obstetrics and Gynecology

## 2022-10-03 DIAGNOSIS — M8580 Other specified disorders of bone density and structure, unspecified site: Secondary | ICD-10-CM

## 2022-11-19 ENCOUNTER — Ambulatory Visit
Admission: RE | Admit: 2022-11-19 | Discharge: 2022-11-19 | Disposition: A | Discharge status: Home / Self Care | Payer: BC Managed Care – PPO | Source: Ambulatory Visit | Attending: Family Medicine | Admitting: Family Medicine

## 2022-11-19 DIAGNOSIS — Z1231 Encounter for screening mammogram for malignant neoplasm of breast: Secondary | ICD-10-CM | POA: Diagnosis not present

## 2023-07-20 ENCOUNTER — Ambulatory Visit: Payer: BC Managed Care – PPO | Admitting: Family Medicine

## 2023-08-18 ENCOUNTER — Encounter: Payer: BC Managed Care – PPO | Admitting: Family Medicine

## 2023-08-28 ENCOUNTER — Encounter: Payer: Self-pay | Admitting: Family Medicine

## 2023-08-28 ENCOUNTER — Other Ambulatory Visit: Payer: Self-pay

## 2023-08-28 ENCOUNTER — Ambulatory Visit (INDEPENDENT_AMBULATORY_CARE_PROVIDER_SITE_OTHER): Payer: Medicare Other | Admitting: Family Medicine

## 2023-08-28 VITALS — BP 100/70 | HR 85 | Temp 97.0°F | Ht 66.0 in | Wt 163.8 lb

## 2023-08-28 DIAGNOSIS — E785 Hyperlipidemia, unspecified: Secondary | ICD-10-CM

## 2023-08-28 DIAGNOSIS — Z0000 Encounter for general adult medical examination without abnormal findings: Secondary | ICD-10-CM | POA: Diagnosis not present

## 2023-08-28 DIAGNOSIS — E559 Vitamin D deficiency, unspecified: Secondary | ICD-10-CM

## 2023-08-28 NOTE — Patient Instructions (Addendum)
Doris Brown , Thank you for taking time to come for your Medicare Wellness Visit. I appreciate your ongoing commitment to your health goals. Please review the following plan we discussed and let me know if I can assist you in the future.   These are the goals we discussed: Continue your excellent job with walking Continue gradual weight loss- another 5 lbs in next year  This is a list of the screening recommended for you and due dates:  Health Maintenance  Topic Date Due   Pap with HPV screening  11/29/2022   COVID-19 Vaccine (4 - 2023-24 season) 09/13/2023*   Mammogram  11/20/2023   DEXA scan (bone density measurement)  12/08/2023   Medicare Annual Wellness Visit  08/27/2024   Colon Cancer Screening  11/29/2028   DTaP/Tdap/Td vaccine (3 - Td or Tdap) 01/22/2032   Pneumonia Vaccine  Completed   Flu Shot  Completed   Hepatitis C Screening  Completed   HIV Screening  Completed   Zoster (Shingles) Vaccine  Completed   HPV Vaccine  Aged Out  *Topic was postponed. The date shown is not the original due date.    Please stop by lab before you go If you have mychart- we will send your results within 3 business days of [place] receiving them.  If you do not have mychart- we will call you about results within 5 business days of [place] receiving them.  *please also note that you will see labs on mychart as soon as they post. I will later go in and write notes on them- will say "notes from Dr. Durene Cal"   Recommended follow up: Return in about 1 year (around 08/27/2024) for followup or sooner if needed.Schedule b4 you leave.

## 2023-08-28 NOTE — Progress Notes (Signed)
Phone: [phone]   Subjective:  Patient presents today for their Welcome to Medicare Exam    Preventive Screening-Counseling & Management  Vision screen:  Vision Screening   Right eye Left eye Both eyes  Without correction     With correction 20/16 20/16 20/16    Advanced directives: Full code. Husband HCPOA. Has living will with lawyer  Modifiable Risk Factors/behavioral risk assessment/psychosocial risk assessment Regular exercise: daily walks for 40-45 minutes. No strength training Diet: generally reasonably healthy- down 8 lbs from last year- cut down on carbohydrates- pasta, potatoes etc and helpful- encouraged gradual weight loss ongoing Wt Readings from Last 3 Encounters:  08/28/23 163 lb 12.8 oz (74.3 kg)  07/18/22 171 lb 12.8 oz (77.9 kg)  12/25/21 167 lb (75.8 kg)  Smoking Status: Never Smoker Second Hand Smoking status: No smokers in home Alcohol intake: 7 per week Other substance abuse/illicit drugs: none  Cardiac risk factors:  advanced age (older than 43 for men, 37 for women) - yes untreated Hyperlipidemia  but with reassuring CT calcium scoring Lab Results  Component Value Date   CHOL 241 (H) 07/18/2022   HDL 88 07/18/2022   LDLCALC 135 (H) 07/18/2022   TRIG 81 07/18/2022   CHOLHDL 2.7 07/18/2022  no Hypertension  No diabetes Lab Results  Component Value Date   HGBA1C 5.1 12/18/2017  Family History: no history CAD   Depression Screen/risk evaluation Risk factors: none. PHQ2 0  and even phq9 of 0    08/28/2023    2:00 PM 07/18/2022    2:23 PM 01/23/2021    1:32 PM 11/29/2019    1:16 PM 12/18/2017   11:02 AM  Depression screen PHQ 2/9  Decreased Interest 0 0 0 0 0  Down, Depressed, Hopeless 0 0 0 0 0  PHQ - 2 Score 0 0 0 0 0  Altered sleeping 0 0 1 1   Tired, decreased energy 0 0 0 0   Change in appetite 0 0 0 0   Feeling bad or failure about yourself  0 0 0 0   Trouble concentrating 0 0 0 0   Moving slowly or fidgety/restless 0 0 0 0    Suicidal thoughts 0 0 0 0   PHQ-9 Score 0 0 1 1   Difficult doing work/chores Not difficult at all Not difficult at all Not difficult at all Not difficult at all     Functional ability and level of safety Mobility assessment:  timed get up and go <12 seconds Activities of Daily Living- Independent in ADLs (toileting, bathing, dressing, transferring, eating) and in IADLs (shopping, housekeeping, managing own medications, and handling finances) Home Safety: Loose rugs (does have one rug that she is considering replacing- hits lip at times but no falls/injury), smoke detectors (up to date ), small pets (40 pounds dog but has not tripped on- dog usually good about getting out of way), grab bars (has these already), stairs (2 flights- no issues with stairs), life-alert system (would use cell phone) Hearing Difficulties: -patient declines Fall Risk: None     08/28/2023    2:00 PM 01/23/2021    1:32 PM 11/29/2019    1:16 PM 05/21/2013   11:06 PM  Fall Risk   Falls in the past year? 0 0 0 No  Number falls in past yr: 0 0 0   Injury with Fall? 0 0 0   Risk for fall due to : No Fall Risks     Follow up Falls evaluation completed  Opioid use history:  no long term opioids use Self assessment of health status: excellent  Required Immunizations needed today:  already had flu shot, consider COVID at pharmacy , also due for Tetanus, Diphtheria, and Pertussis (Tdap) at pharmacy - but actually already had this- team will log. RSV already had as well as Prevnar.  Immunization History  Administered Date(s) Administered   Influenza, High Dose Seasonal PF 06/07/2023   Influenza,inj,Quad PF,6+ Mos 06/27/2014, 06/25/2017, 07/08/2018, 06/26/2019, 06/06/2022   Influenza-Unspecified 06/23/2020   Moderna SARS-COV2 Booster Vaccination 08/21/2020   Moderna Sars-Covid-2 Vaccination 11/18/2019, 12/16/2019   PNEUMOCOCCAL CONJUGATE-20 06/18/2022   Respiratory Syncytial Virus Vaccine,Recomb Aduvanted(Arexvy)  07/19/2022   Tdap 05/16/2013, 01/21/2022   Zoster Recombinant(Shingrix) 06/22/2020, 09/04/2020   Health Maintenance  Topic Date Due   Pap with HPV screening  11/29/2022   COVID-19 Vaccine (4 - 2023-24 season) 09/13/2023*   Mammogram  11/20/2023   DEXA scan (bone density measurement)  12/08/2023   Medicare Annual Wellness Visit  08/27/2024   Colon Cancer Screening  11/29/2028   DTaP/Tdap/Td vaccine (3 - Td or Tdap) 01/22/2032   Pneumonia Vaccine  Completed   Flu Shot  Completed   Hepatitis C Screening  Completed   HIV Screening  Completed   Zoster (Shingles) Vaccine  Completed   HPV Vaccine  Aged Out  *Topic was postponed. The date shown is not the original due date.   Screening tests-  Colon cancer screening- November 30 2018 with 10 year repeat with Dr Leone Payor Lung Cancer screening-  not a candidate Skin cancer screening- Dr. Donzetta Starch as needed 4. Cervical cancer screening- scheduled for pap next month and we requested last pap- we do have one on file 11/29/19 and human papilloma virus negative with [facility] so this should be good for 5 years 5. Breast cancer screening- 3 D mammograms with sister with breast cancer- most recently 11/19/22 6. DEXA- through GYN 12/08/2018 with gynecology and next will be with them next week  The following were reviewed and entered/updated in epic: Past Medical History:  Diagnosis Date   Allergy 2010   Seasonal allergies   Cataract 2020   Moderate not yet requiring surgery.   Chest tightness    stress test in 2010s was reassuring after pain with mowing lawn. mild intermittent not exertiona since that time   Hyperlipidemia 2014   Elevated cholesterol   Patient Active Problem List   Diagnosis Date Noted   Hyperlipidemia, unspecified 11/29/2019    Priority: Medium    Vitamin D deficiency 11/29/2019    Priority: Low   Allergic rhinitis 05/21/2013    Priority: Low   Past Surgical History:  Procedure Laterality Date   CHOLECYSTECTOMY      COLONOSCOPY  04/22/2016   CIN1 after an ascus   DILATION AND CURETTAGE OF UTERUS     WISDOM TOOTH EXTRACTION      Family History  Problem Relation Age of Onset   Breast cancer Sister        survived- lumpectomy and radiation   Other Sister        ovarian growths were not cancerous   Cancer Sister    Breast cancer Paternal Aunt    Pancreatic cancer Paternal Aunt    Osteoporosis Mother    Glaucoma Mother    Other Father        was MD. benign brain tumor- cholesteatoma posterior fossa. 3 recurrence and last noted on CT- surgery prior   Leukemia Maternal Grandmother  Hypertension Maternal Grandmother    Cancer Maternal Grandmother    Obesity Maternal Grandmother    Other Maternal Grandfather        died at age 87   Lung cancer Paternal Grandmother        nonsmoker- worked in Producer, television/film/video of sewing industry   Cancer Paternal Grandmother    Obesity Paternal Grandmother    Parkinson's disease Paternal Grandfather    Cancer Paternal Aunt    Colon cancer Neg Hx    Colon polyps Neg Hx    Esophageal cancer Neg Hx    Rectal cancer Neg Hx    Stomach cancer Neg Hx     Medications- reviewed and updated Current Outpatient Medications  Medication Sig Dispense Refill   Multiple Vitamin (MULTIVITAMIN) capsule Take 1 capsule by mouth daily.     No current facility-administered medications for this visit.    Allergies-reviewed and updated Allergies  Allergen Reactions   Sulfa Antibiotics Rash   Codeine Nausea Only   Septra [Sulfamethoxazole-Trimethoprim] Rash    Social History   Socioeconomic History   Marital status: Married    Spouse name: Not on file   Number of children: Not on file   Years of education: Not on file   Highest education level: Not on file  Occupational History   Not on file  Tobacco Use   Smoking status: Never   Smokeless tobacco: Never  Vaping Use   Vaping status: Never Used  Substance and Sexual Activity   Alcohol use: Yes    Alcohol/week:  7.0 standard drinks of alcohol    Types: 7 Glasses of wine per week   Drug use: No   Sexual activity: Yes    Partners: Male    Birth control/protection: Post-menopausal, None    Comment: husband only  Other Topics Concern   Not on file  Social History Narrative   Married. 2 grown children from prior marriage. 3 step children. 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 Started as pediatric ICU nurse.    -masters health administration- [employer]   - Undergrad and nursing at [employer]: place on [employer] (husband big fisherman), reading, cooking    Social Determinants of Health   Financial Resource Strain: Not on file  Food Insecurity: Not on file  Transportation Needs: Not on file  Physical Activity: Not on file  Stress: Not on file  Social Connections: Not on file   Objective  Objective:  BP 100/70   Pulse 85   Temp (!) 97 F (36.1 C)   Ht 5\' 6"  (1.676 m)   Wt 163 lb 12.8 oz (74.3 kg)   SpO2 99%   BMI 26.44 kg/m  Gen: NAD, resting comfortably HEENT: Mucous membranes are moist. Oropharynx normal, septum deviated to right Neck: no thyromegaly CV: RRR no murmurs rubs or gallops Lungs: CTAB no crackles, wheeze, rhonchi Abdomen: soft/nontender/nondistended/normal bowel sounds. No rebound or guarding.  Ext: no edema Skin: warm, dry Neuro: grossly normal, moves all extremities, PERRLA   Assessment and Plan:   Welcome to Medicare exam completed-  Educated, counseled and referred based on above elements Educated, counseled and referred as appropriate for preventative needs Discussed  and documented a written plan for preventiative services and screenings with personalized health advice- After Visit  Summary was given to patient which included this plan  4. EKG offered [id number]- patient opts out - had EKG baseline 12/25/21  Status of chronic or acute concerns   #prior pvc workup with cardiology-  -noted on cardiac monitor, only mild changes on echocardiogram including trivial MV leakiness and mildly calcified aortic valve   and CT coronary morphology as below - no recent palpitations  #hyperlipidemia- Ct calcium scoring 0 on coronary morphology 01/23/22 S: Medication:none   Lab Results  Component Value Date   CHOL 241 (H) 07/18/2022   HDL 88 07/18/2022   LDLCALC 135 (H) 07/18/2022   TRIG 81 07/18/2022   CHOLHDL 2.7 07/18/2022  A/P:update lipids- unlikely to consider medicine with reassuring CT calcium scoring unless significant elevatoin  #Vitamin D deficiency S: Medication: multivitamin with vitamin D     plus extra D a few times a week Last vitamin D Lab Results  Component Value Date   VD25OH 36 07/18/2022  A/P: hopefully stable- update vitamin d today. Continue current meds for now    #right hip pain in mornings in particular - - pain is lateral and over greater trochanter wonder about bursisits -hip exam largely normal other than pain in that area- she will reach out if wants to see physical therapy or sports medicine    Recommended follow up: Return in about 1 year (around 08/27/2024) for followup or sooner if needed.Schedule b4 you leave.  Future Appointments  Date Time Provider Department Center  09/04/2023 12:00 PM [facility] DX DEXA 1 [facility]     Lab/Order associations:   ICD-10-CM   1. Preventative health care  Z00.00     2. Vitamin D deficiency  E55.9 VITAMIN D 25 Hydroxy (Vit-D Deficiency, Fractures)    3. Hyperlipidemia, unspecified hyperlipidemia type  E78.5 Comprehensive metabolic panel    CBC with Differential/Platelet    Lipid panel      No orders of the defined types were placed in this encounter.   Return precautions  advised. Tana Conch, MD

## 2023-08-29 LAB — LIPID PANEL
Cholesterol: 237 mg/dL — ABNORMAL HIGH (ref <200)
HDL: 97 mg/dL (ref >=50)
LDL Cholesterol (Calc): 123 mg/dL — ABNORMAL HIGH
Non-HDL Cholesterol (Calc): 140 mg/dL — ABNORMAL HIGH (ref <130)
Total CHOL/HDL Ratio: 2.4 (calc) (ref <5.0)
Triglycerides: 76 mg/dL (ref <150)

## 2023-08-29 LAB — COMPREHENSIVE METABOLIC PANEL
AG Ratio: 1.5 (calc) (ref 1.0–2.5)
ALT: 11 U/L (ref 6–29)
AST: 19 U/L (ref 10–35)
Albumin: 4.1 g/dL (ref 3.6–5.1)
Alkaline phosphatase (APISO): 61 U/L (ref 37–153)
BUN: 19 mg/dL (ref 7–25)
CO2: 26 mmol/L (ref 20–32)
Calcium: 9.4 mg/dL (ref 8.6–10.4)
Chloride: 103 mmol/L (ref 98–110)
Creat: 0.79 mg/dL (ref 0.50–1.05)
Globulin: 2.8 g/dL (ref 1.9–3.7)
Glucose, Bld: 84 mg/dL (ref 65–99)
Potassium: 3.9 mmol/L (ref 3.5–5.3)
Sodium: 139 mmol/L (ref 135–146)
Total Bilirubin: 0.7 mg/dL (ref 0.2–1.2)
Total Protein: 6.9 g/dL (ref 6.1–8.1)

## 2023-08-29 LAB — CBC WITH DIFFERENTIAL/PLATELET
Absolute Lymphocytes: 1958 {cells}/uL (ref 850–3900)
Absolute Monocytes: 407 {cells}/uL (ref 200–950)
Basophils Absolute: 61 {cells}/uL (ref 0–200)
Basophils Relative: 1.1 %
Eosinophils Absolute: 39 {cells}/uL (ref 15–500)
Eosinophils Relative: 0.7 %
HCT: 39.3 % (ref 35.0–45.0)
Hemoglobin: 12.8 g/dL (ref 11.7–15.5)
MCH: 30.1 pg (ref 27.0–33.0)
MCHC: 32.6 g/dL (ref 32.0–36.0)
MCV: 92.5 fL (ref 80.0–100.0)
MPV: 10.2 fL (ref 7.5–12.5)
Monocytes Relative: 7.4 %
Neutro Abs: 3036 {cells}/uL (ref 1500–7800)
Neutrophils Relative %: 55.2 %
Platelets: 291 10*3/uL (ref 140–400)
RBC: 4.25 10*6/uL (ref 3.80–5.10)
RDW: 12.1 % (ref 11.0–15.0)
Total Lymphocyte: 35.6 %
WBC: 5.5 10*3/uL (ref 3.8–10.8)

## 2023-08-29 LAB — VITAMIN D 25 HYDROXY (VIT D DEFICIENCY, FRACTURES): Vit D, 25-Hydroxy: 34 ng/mL (ref 30–100)

## 2023-09-04 ENCOUNTER — Ambulatory Visit
Admission: RE | Admit: 2023-09-04 | Discharge: 2023-09-04 | Disposition: A | Discharge status: Home / Self Care | Payer: Medicare Other | Source: Ambulatory Visit | Attending: Obstetrics and Gynecology | Admitting: Obstetrics and Gynecology

## 2023-09-04 DIAGNOSIS — M8580 Other specified disorders of bone density and structure, unspecified site: Secondary | ICD-10-CM

## 2023-10-08 ENCOUNTER — Other Ambulatory Visit: Payer: Self-pay | Admitting: Family Medicine

## 2023-10-08 DIAGNOSIS — Z1231 Encounter for screening mammogram for malignant neoplasm of breast: Secondary | ICD-10-CM

## 2023-11-11 ENCOUNTER — Other Ambulatory Visit: Payer: Self-pay | Admitting: Medical Genetics

## 2023-11-19 ENCOUNTER — Other Ambulatory Visit (HOSPITAL_COMMUNITY)
Admission: RE | Admit: 2023-11-19 | Discharge: 2023-11-19 | Disposition: A | Discharge status: Home / Self Care | Payer: Self-pay | Source: Ambulatory Visit | Attending: Medical Genetics | Admitting: Medical Genetics

## 2023-11-25 ENCOUNTER — Ambulatory Visit
Admission: RE | Admit: 2023-11-25 | Discharge: 2023-11-25 | Disposition: A | Discharge status: Home / Self Care | Payer: Medicare Other | Source: Ambulatory Visit | Attending: Family Medicine | Admitting: Family Medicine

## 2023-11-25 DIAGNOSIS — Z1231 Encounter for screening mammogram for malignant neoplasm of breast: Secondary | ICD-10-CM

## 2023-12-01 LAB — GENECONNECT MOLECULAR SCREEN: Genetic Analysis Overall Interpretation: NEGATIVE

## 2023-12-09 IMAGING — CT CT HEART MORP W/ CTA COR W/ SCORE W/ CA W/CM &/OR W/O CM
4 of 7 series · 8 of 20 positions shown, 9 images · non-contrast
Comparison: None Available.
COMPARISON: None Available.

Addendum:
EXAM:
OVER-READ INTERPRETATION  CT CHEST

The following report is an over-read performed by radiologist Dr.
Nazareth Jumper [REDACTED] on 01/23/2022. This over-read
does not include interpretation of cardiac or coronary anatomy or
pathology. The coronary CTA interpretation by the cardiologist is
attached.
CLINICAL DATA: This is a 66 year old female with anginal symptoms.
Cardiac/Coronary  CTA
TECHNIQUE: The patient was scanned on a Phillips Force scanner.

[Series 6: best diast · axial · 0.39mm/px · z∈[+1250,+1285]mm · 2 of 261 slices shown, 3 images]
[im 87/261  vessel]
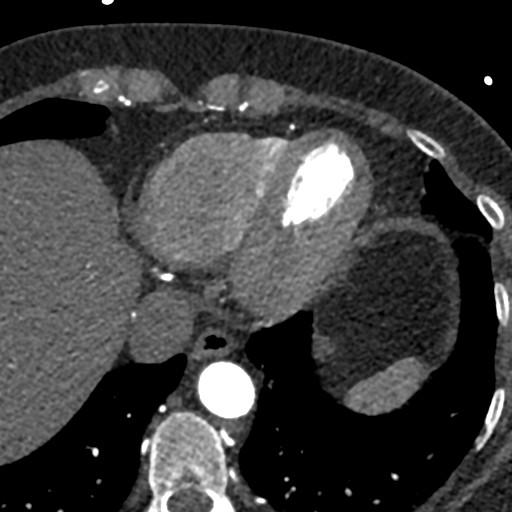
[im 87/261  lung]
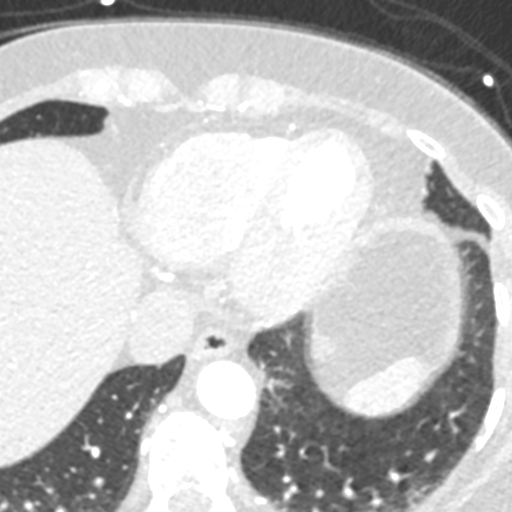
[im 174/261  vessel]
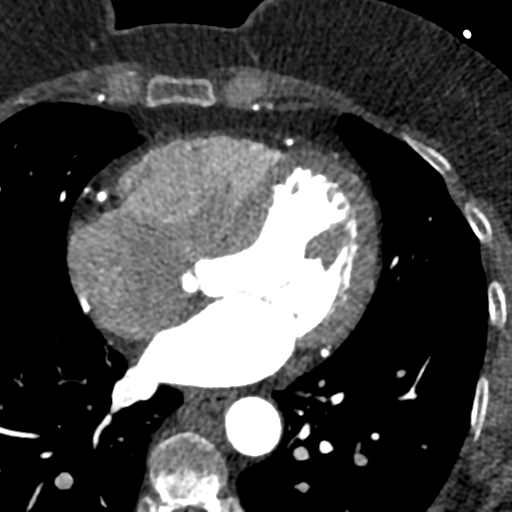

[Series 7: best syst · axial · 0.39mm/px · z∈[+1250,+1285]mm · 2 of 261 slices shown]
[im 87/261  vessel]
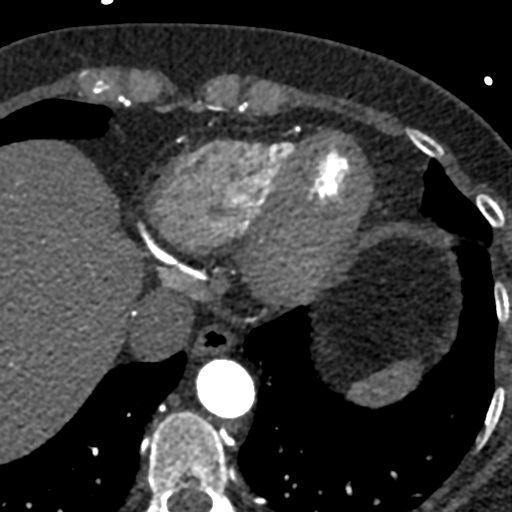
[im 174/261  vessel]
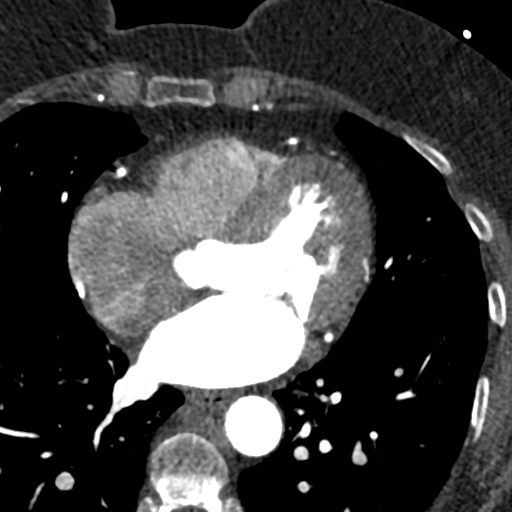

[Series 8: ts diast sharp · axial · 0.39mm/px · z∈[+1250,+1285]mm · 2 of 261 slices shown]
[im 87/261  lung]
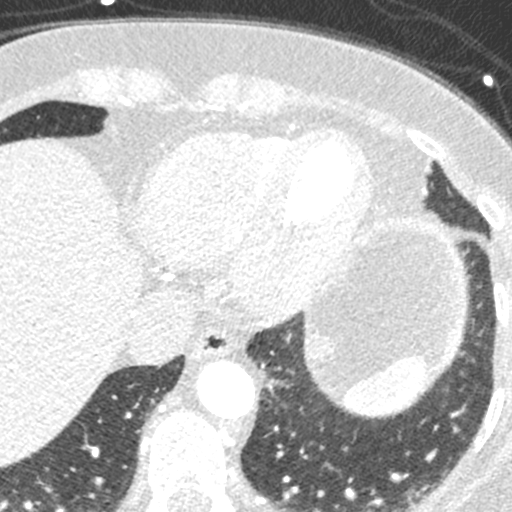
[im 174/261  lung]
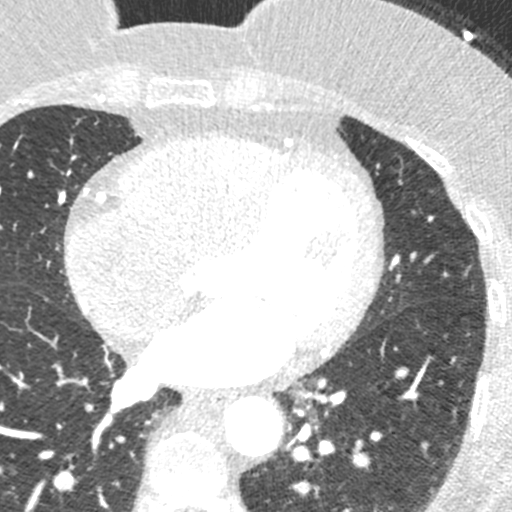

[Series 9: ts syst sharp · axial · 0.39mm/px · z∈[+1250,+1285]mm · 2 of 261 slices shown]
[im 87/261  lung]
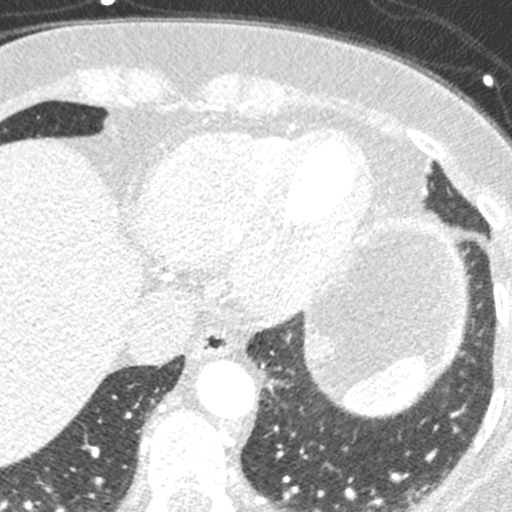
[im 174/261  lung]
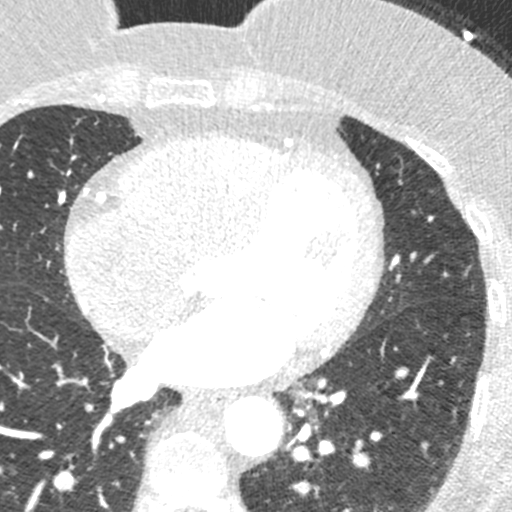

[8 of 20 positions shown; findings below may reference images not displayed]

FINDINGS: Vascular: Heart is normal size.  Aorta normal caliber.

Mediastinum/Nodes: No adenopathy

Lungs/Pleura: No confluent opacities or effusions.

Upper Abdomen: No acute findings

Musculoskeletal: Chest wall soft tissues are unremarkable. No acute
bony abnormality.
IMPRESSION: No acute or significant extracardiac abnormality.
FINDINGS: A 100 kV prospective scan was triggered in the descending thoracic
aorta at 111 HU's. Axial non-contrast 3 mm slices were carried out
through the heart. The data set was analyzed on a dedicated work
station and scored using the Agatson method. Gantry rotation speed
was 250 msecs and collimation was .6 mm. No beta blockade and 0.8 mg
of sl NTG was given. The 3D data set was reconstructed in 5%
intervals of the 67-82 % of the R-R cycle. Diastolic phases were
analyzed on a dedicated work station using MPR, MIP and VRT modes.
The patient received 80 cc of contrast.

Aorta: Normal size.  No calcifications.  No dissection.

Aortic Valve:  Trileaflet.  No calcifications.

Coronary Arteries:  Normal coronary origin.  Right dominance.

RCA is a large dominant artery that gives rise to PDA and PLA. There
is no plaque.

Left main is a large artery that gives rise to LAD and LCX arteries.

LAD is a large vessel that has no plaque.

LCX is a non-dominant artery that gives rise to one large OM1
branch. There is no plaque.

Coronary Calcium Score:

Left main: 0

Left anterior descending artery: 0

Left circumflex artery: 0

Right coronary artery: 0

Total: 0

Percentile: 0

Other findings:

Normal pulmonary vein drainage into the left atrium.

Normal left atrial appendage without a thrombus.

Normal size of the pulmonary artery.
IMPRESSION: 1. Coronary calcium score of 0. This was 0 percentile for age and
sex matched control.

2. Normal coronary origin with right dominance.

3. No evidence of CAD. CAD-RADS 0. No evidence of CAD (0%). Consider
non-atherosclerotic causes of chest pain.

The noncardiac portion of this study will be interpreted in separate
report by the radiologist.

*** End of Addendum ***
EXAM:
OVER-READ INTERPRETATION  CT CHEST

The following report is an over-read performed by radiologist Dr.
Nazareth Jumper [REDACTED] on 01/23/2022. This over-read
does not include interpretation of cardiac or coronary anatomy or
pathology. The coronary CTA interpretation by the cardiologist is
attached.
FINDINGS: Vascular: Heart is normal size.  Aorta normal caliber.

Mediastinum/Nodes: No adenopathy

Lungs/Pleura: No confluent opacities or effusions.

Upper Abdomen: No acute findings

Musculoskeletal: Chest wall soft tissues are unremarkable. No acute
bony abnormality.
IMPRESSION: No acute or significant extracardiac abnormality.

## 2024-09-01 ENCOUNTER — Ambulatory Visit: Payer: Medicare Other | Admitting: Family Medicine

## 2024-09-06 ENCOUNTER — Encounter: Payer: Self-pay | Admitting: Family Medicine

## 2024-09-06 ENCOUNTER — Ambulatory Visit: Payer: Self-pay | Admitting: Family Medicine

## 2024-09-06 ENCOUNTER — Ambulatory Visit: Admitting: Family Medicine

## 2024-09-06 VITALS — BP 122/78 | HR 92 | Temp 98.2°F | Ht 66.0 in | Wt 161.8 lb

## 2024-09-06 DIAGNOSIS — J3089 Other allergic rhinitis: Secondary | ICD-10-CM

## 2024-09-06 DIAGNOSIS — R0982 Postnasal drip: Secondary | ICD-10-CM

## 2024-09-06 DIAGNOSIS — E785 Hyperlipidemia, unspecified: Secondary | ICD-10-CM | POA: Diagnosis not present

## 2024-09-06 DIAGNOSIS — B9689 Other specified bacterial agents as the cause of diseases classified elsewhere: Secondary | ICD-10-CM

## 2024-09-06 DIAGNOSIS — J329 Chronic sinusitis, unspecified: Secondary | ICD-10-CM | POA: Diagnosis not present

## 2024-09-06 DIAGNOSIS — E559 Vitamin D deficiency, unspecified: Secondary | ICD-10-CM

## 2024-09-06 DIAGNOSIS — R4189 Other symptoms and signs involving cognitive functions and awareness: Secondary | ICD-10-CM

## 2024-09-06 LAB — LIPID PANEL
Cholesterol: 218 mg/dL — ABNORMAL HIGH (ref 28–200)
HDL: 88.5 mg/dL (ref >39.00)
LDL Cholesterol: 118 mg/dL — ABNORMAL HIGH (ref 10–99)
NonHDL: 129.98
Total CHOL/HDL Ratio: 2
Triglycerides: 61 mg/dL (ref 10.0–149.0)
VLDL: 12.2 mg/dL (ref 0.0–40.0)

## 2024-09-06 LAB — VITAMIN B12: Vitamin B-12: 522 pg/mL (ref 211–911)

## 2024-09-06 LAB — CBC WITH DIFFERENTIAL/PLATELET
Basophils Absolute: 0.1 K/uL (ref 0.0–0.1)
Basophils Relative: 1.1 % (ref 0.0–3.0)
Eosinophils Absolute: 0 K/uL (ref 0.0–0.7)
Eosinophils Relative: 0.5 % (ref 0.0–5.0)
HCT: 41.1 % (ref 36.0–46.0)
Hemoglobin: 14 g/dL (ref 12.0–15.0)
Lymphocytes Relative: 27.9 % (ref 12.0–46.0)
Lymphs Abs: 1.6 K/uL (ref 0.7–4.0)
MCHC: 34.1 g/dL (ref 30.0–36.0)
MCV: 90.3 fl (ref 78.0–100.0)
Monocytes Absolute: 0.4 K/uL (ref 0.1–1.0)
Monocytes Relative: 6 % (ref 3.0–12.0)
Neutro Abs: 3.8 K/uL (ref 1.4–7.7)
Neutrophils Relative %: 64.5 % (ref 43.0–77.0)
Platelets: 318 K/uL (ref 150.0–400.0)
RBC: 4.55 Mil/uL (ref 3.87–5.11)
RDW: 13 % (ref 11.5–15.5)
WBC: 5.9 K/uL (ref 4.0–10.5)

## 2024-09-06 LAB — COMPREHENSIVE METABOLIC PANEL WITH GFR
ALT: 14 U/L (ref 3–35)
AST: 21 U/L (ref 5–37)
Albumin: 4.5 g/dL (ref 3.5–5.2)
Alkaline Phosphatase: 62 U/L (ref 39–117)
BUN: 22 mg/dL (ref 6–23)
CO2: 28 meq/L (ref 19–32)
Calcium: 10.1 mg/dL (ref 8.4–10.5)
Chloride: 102 meq/L (ref 96–112)
Creatinine, Ser: 0.78 mg/dL (ref 0.40–1.20)
GFR: 78.97 mL/min (ref >60.00)
Glucose, Bld: 97 mg/dL (ref 70–99)
Potassium: 4.1 meq/L (ref 3.5–5.1)
Sodium: 138 meq/L (ref 135–145)
Total Bilirubin: 0.5 mg/dL (ref 0.2–1.2)
Total Protein: 7.8 g/dL (ref 6.0–8.3)

## 2024-09-06 LAB — VITAMIN D 25 HYDROXY (VIT D DEFICIENCY, FRACTURES): VITD: 33.31 ng/mL (ref 30.00–100.00)

## 2024-09-06 LAB — TSH: TSH: 1.03 u[IU]/mL (ref 0.35–5.50)

## 2024-09-06 MED ORDER — AMOXICILLIN-POT CLAVULANATE 875-125 MG PO TABS: 1.0000 | ORAL_TABLET | Freq: Two times a day (BID) | ORAL | 0 refills | Status: AC

## 2024-09-06 MED ORDER — FLUTICASONE PROPIONATE 50 MCG/ACT NA SUSP: 2.0000 | Freq: Every day | NASAL | 3 refills | Status: AC

## 2024-09-06 NOTE — Progress Notes (Signed)
 Phone [phone] In person visit   Subjective:   Doris Brown is a 68 y.o. year old very pleasant female patient who presents for/with See problem oriented charting Chief Complaint  Patient presents with   Annual Exam    Pt is fasting; constant post nasal drip that will not go away that is getting worse; memory changes that she has noticed x1 year, MMSE placed in room;    Hyperlipidemia    Past Medical History-  Patient Active Problem List   Diagnosis Date Noted   Hyperlipidemia, unspecified 11/29/2019    Priority: Medium    Vitamin D  deficiency 11/29/2019    Priority: Low   Allergic rhinitis 05/21/2013    Priority: Low    Medications- reviewed and updated Current Outpatient Medications  Medication Sig Dispense Refill   calcium carbonate (OSCAL) 1500 (600 Ca) MG TABS tablet Take 1,500 mg by mouth in the morning and at bedtime.     Multiple Vitamin (MULTIVITAMIN) capsule Take 1 capsule by mouth daily.     No current facility-administered medications for this visit.     Objective:  BP 122/78 (BP Location: Left Arm, Patient Position: Sitting, Cuff Size: Normal)   Pulse 92   Temp 98.2 F (36.8 C) (Temporal)   Ht 5' 6 (1.676 m)   Wt 161 lb 12.8 oz (73.4 kg)   SpO2 97%   BMI 26.12 kg/m  Gen: NAD, resting comfortably Deviated septum to right. Tympanic membrane normal.  Pharynx with mild erythema.  Nasal turbinates edematous-narrow passageway on the right due to deviated septum.  Maxillary sinus tenderness noted bilaterally CV: RRR no murmurs rubs or gallops Lungs: CTAB no crackles, wheeze, rhonchi Abdomen: soft/nontender/nondistended/normal bowel sounds.  Ext: no edema Skin: warm, dry     Assessment and Plan   # Health maintenance - breast cancer sister and paternal aunt- gets 3d mammograms 11/25/23 and clinical breast exam with GYN -Cervical cancer screening-follows with [facility] but past formal HPV screening recommendations. Good reports lately  -DEXA  with GYN December 2024 with osteopenia with worst T-score -1.9at right femur neck which was significantly worse they added calcium -Immunizations- up to date, holding off on COVID. Had Tetanus, Diphtheria, and Pertussis (Tdap) 2023, flu shot in October. Already did shingrix and pneumonia vaccine   # Postnasal drip/sinus pressure S: Reports this is been going on for years and seems to be getting worse lately- had an airport cold from November and just cannot get rid of it since then. Some sinus pressure in maxillary sinuses but not as severe as she's had in past. Has tried sudafed and benadryl and claritin but not Flonase  yet. Tries to avoid afrin. Clear discharge when blows out A/P: chronic nasal congestion- wonder if she has irritant with allergic rhinitis nonseasonal that she's being exposed to causing chronic issues with poor control- advised Flonase  trial with ongoing post nasal drip  but with sinus pressure and discharge in  last several weeks that has persisted after her travels concerned for bacterial sinusitis as well and covering with 7 days of augmentin   # Memory loss concern S: Patient has noted mild issues over the last year. Will forget things that she was going to tell someone within 5 minutes for instance and may come back to her or may never come back. Always short term issues A/P: MMSE completed and 30/30 we still agreed to monitor and want to follow up if worsening    #Palpitations- sparing- prior pvc workup with cardiology  -noted  on cardiac monitor, only mild changes on echocardiogram including trivial MV leakiness and mildly calcified aortic valve  and CT coronary morphology as below  #hyperlipidemia- Ct calcium scoring 0 on coronary morphology 01/23/22 S: Medication:none  Lab Results  Component Value Date   CHOL 237 (H) 08/28/2023   HDL 97 08/28/2023   LDLCALC 123 (H) 08/28/2023   TRIG 76 08/28/2023   CHOLHDL 2.4 08/28/2023  A/P:lipids mildly high and likes to focus on  lifestyle over medications especiallyw ith reassuring CT calcium scoring- update lipids, CBC, CMP today  #Vitamin D  deficiency with osteopenia- so added calcium chews S: Medication: multivitamin with vitamin D    plus calcium chews that have more D Lab Results  Component Value Date   VD25OH 34 08/28/2023    A/P: hopefully stable- update vitamin D   today. Continue current meds for now      Recommended follow up: Return in about 1 year (around 09/06/2025) for followup or sooner if needed.Schedule b4 you leave.  Lab/Order associations: Fasting   ICD-10-CM   1. Bacterial sinusitis  J32.9    B96.89     2. Hyperlipidemia, unspecified hyperlipidemia type  E78.5 CBC with Differential/Platelet    Lipid panel    Comprehensive metabolic panel    TSH    3. Vitamin D  deficiency  E55.9 VITAMIN D  25 Hydroxy (Vit-D Deficiency, Fractures)    4. Postnasal drip  R09.82     5. Concern about memory  R41.89 TSH    Vitamin B12    6. Non-seasonal allergic rhinitis, unspecified trigger  J30.89       Meds ordered this encounter  Medications   amoxicillin -clavulanate (AUGMENTIN ) 875-125 MG tablet    Sig: Take 1 tablet by mouth 2 (two) times daily for 7 days.    Dispense:  14 tablet    Refill:  0   fluticasone  (FLONASE ) 50 MCG/ACT nasal spray    Sig: Place 2 sprays into both nostrils daily.    Dispense:  16 g    Refill:  3    Return precautions advised.  Garnette Lukes, MD

## 2024-09-06 NOTE — Patient Instructions (Addendum)
 Please stop by lab before you go If you have mychart- we will send your results within 3 business days of us  receiving them.  If you do not have mychart- we will call you about results within 5 business days of us  receiving them.  *please also note that you will see labs on mychart as soon as they post. I will later go in and write notes on them- will say notes from Dr. Katrinka   chronic nasal congestion- wonder if she has irritant that she's being exposed to- advised Flonase  trial  at least 2 weeks with ongoing post nasal drip  but with sinus pressure and discharge in  last several weeks that has persisted after her travels concerned for bacterial sinusitis as well and covering with 7 days of augmentin   Recommended follow up: Return in about 1 year (around 09/06/2025) for followup or sooner if needed.Schedule b4 you leave.

## 2024-10-26 ENCOUNTER — Other Ambulatory Visit: Payer: Self-pay | Admitting: Family Medicine

## 2024-10-26 DIAGNOSIS — Z1231 Encounter for screening mammogram for malignant neoplasm of breast: Secondary | ICD-10-CM

## 2024-11-28 ENCOUNTER — Ambulatory Visit

## 2025-09-06 ENCOUNTER — Ambulatory Visit: Admitting: Family Medicine
